# Patient Record
Sex: Male | Born: 1965 | Race: White | Hispanic: No | Marital: Married | Smoking: Former smoker
Health system: Southern US, Community
[De-identification: ages and names within clinical notes are randomized; demographics above are authoritative.]

## PROBLEM LIST (undated history)

## (undated) DIAGNOSIS — M1990 Unspecified osteoarthritis, unspecified site: Secondary | ICD-10-CM

## (undated) DIAGNOSIS — F419 Anxiety disorder, unspecified: Secondary | ICD-10-CM

## (undated) DIAGNOSIS — M25519 Pain in unspecified shoulder: Secondary | ICD-10-CM

## (undated) DIAGNOSIS — E785 Hyperlipidemia, unspecified: Secondary | ICD-10-CM

## (undated) DIAGNOSIS — R569 Unspecified convulsions: Secondary | ICD-10-CM

## (undated) DIAGNOSIS — G4730 Sleep apnea, unspecified: Secondary | ICD-10-CM

## (undated) HISTORY — PX: SURGERY SCROTAL / TESTICULAR: SUR1316

## (undated) HISTORY — DX: Pain in unspecified shoulder: M25.519

## (undated) HISTORY — DX: Unspecified osteoarthritis, unspecified site: M19.90

## (undated) HISTORY — PX: WISDOM TOOTH EXTRACTION: SHX21

## (undated) HISTORY — PX: TESTICLE SURGERY: SHX794

## (undated) HISTORY — DX: Sleep apnea, unspecified: G47.30

## (undated) HISTORY — DX: Unspecified convulsions: R56.9

## (undated) HISTORY — DX: Anxiety disorder, unspecified: F41.9

## (undated) HISTORY — DX: Hyperlipidemia, unspecified: E78.5

---

## 1992-10-30 HISTORY — PX: APPENDECTOMY: SHX54

## 2005-01-23 ENCOUNTER — Ambulatory Visit: Payer: Self-pay | Admitting: Internal Medicine

## 2006-05-07 ENCOUNTER — Ambulatory Visit: Payer: Self-pay | Admitting: Internal Medicine

## 2006-08-13 ENCOUNTER — Ambulatory Visit: Payer: Self-pay | Admitting: Internal Medicine

## 2007-06-18 ENCOUNTER — Telehealth: Payer: Self-pay | Admitting: Internal Medicine

## 2007-06-19 ENCOUNTER — Ambulatory Visit: Payer: Self-pay | Admitting: Internal Medicine

## 2007-06-19 LAB — CONVERTED CEMR LAB
ALT: 27 units/L (ref 0–53)
AST: 26 units/L (ref 0–37)
Albumin: 4 g/dL (ref 3.5–5.2)
Alkaline Phosphatase: 45 units/L (ref 39–117)
BUN: 14 mg/dL (ref 6–23)
Basophils Absolute: 0 10*3/uL (ref 0.0–0.1)
Basophils Relative: 0.3 % (ref 0.0–1.0)
Bilirubin Urine: NEGATIVE
Bilirubin, Direct: 0.1 mg/dL (ref 0.0–0.3)
Blood in Urine, dipstick: NEGATIVE
CO2: 26 meq/L (ref 19–32)
Calcium: 9.5 mg/dL (ref 8.4–10.5)
Chloride: 107 meq/L (ref 96–112)
Cholesterol: 151 mg/dL (ref 0–200)
Creatinine, Ser: 1 mg/dL (ref 0.4–1.5)
Eosinophils Absolute: 0.1 10*3/uL (ref 0.0–0.6)
Eosinophils Relative: 1.7 % (ref 0.0–5.0)
GFR calc Af Amer: 106 mL/min
GFR calc non Af Amer: 88 mL/min
Glucose, Bld: 82 mg/dL (ref 70–99)
Glucose, Urine, Semiquant: NEGATIVE
HCT: 39.6 % (ref 39.0–52.0)
HDL: 38.7 mg/dL — ABNORMAL LOW (ref >39.0)
Hemoglobin: 14.2 g/dL (ref 13.0–17.0)
LDL Cholesterol: 94 mg/dL (ref 0–99)
Lymphocytes Relative: 46.3 % — ABNORMAL HIGH (ref 12.0–46.0)
MCHC: 35.8 g/dL (ref 30.0–36.0)
MCV: 89.5 fL (ref 78.0–100.0)
Monocytes Absolute: 0.3 10*3/uL (ref 0.2–0.7)
Monocytes Relative: 8.5 % (ref 3.0–11.0)
Neutro Abs: 1.7 10*3/uL (ref 1.4–7.7)
Neutrophils Relative %: 43.2 % (ref 43.0–77.0)
Nitrite: NEGATIVE
Platelets: 168 10*3/uL (ref 150–400)
Potassium: 3.9 meq/L (ref 3.5–5.1)
Protein, U semiquant: NEGATIVE
RBC: 4.42 M/uL (ref 4.22–5.81)
RDW: 11.6 % (ref 11.5–14.6)
Sodium: 143 meq/L (ref 135–145)
Specific Gravity, Urine: 1.02
TSH: 2.68 microintl units/mL (ref 0.35–5.50)
Total Bilirubin: 1 mg/dL (ref 0.3–1.2)
Total CHOL/HDL Ratio: 3.9
Total Protein: 7.2 g/dL (ref 6.0–8.3)
Triglycerides: 93 mg/dL (ref 0–149)
Urobilinogen, UA: 0.2
VLDL: 19 mg/dL (ref 0–40)
Valproic Acid Lvl: 74.9 ug/mL (ref 50.0–100.0)
WBC Urine, dipstick: NEGATIVE
WBC: 3.9 10*3/uL — ABNORMAL LOW (ref 4.5–10.5)
pH: 6

## 2007-06-28 DIAGNOSIS — R569 Unspecified convulsions: Secondary | ICD-10-CM

## 2007-06-28 DIAGNOSIS — E785 Hyperlipidemia, unspecified: Secondary | ICD-10-CM

## 2007-06-28 HISTORY — DX: Unspecified convulsions: R56.9

## 2007-06-28 HISTORY — DX: Hyperlipidemia, unspecified: E78.5

## 2007-07-02 ENCOUNTER — Ambulatory Visit: Payer: Self-pay | Admitting: Internal Medicine

## 2007-11-08 ENCOUNTER — Ambulatory Visit: Payer: Self-pay | Admitting: Internal Medicine

## 2008-08-10 ENCOUNTER — Telehealth: Payer: Self-pay | Admitting: Internal Medicine

## 2009-03-08 ENCOUNTER — Telehealth: Payer: Self-pay | Admitting: Internal Medicine

## 2009-04-13 ENCOUNTER — Ambulatory Visit: Payer: Self-pay | Admitting: Internal Medicine

## 2009-04-13 LAB — CONVERTED CEMR LAB
Glucose, Urine, Semiquant: NEGATIVE
Nitrite: NEGATIVE
Specific Gravity, Urine: 1.02
WBC Urine, dipstick: NEGATIVE
pH: 5.5

## 2009-04-14 LAB — CONVERTED CEMR LAB
AST: 24 units/L (ref 0–37)
Albumin: 4.3 g/dL (ref 3.5–5.2)
Alkaline Phosphatase: 59 units/L (ref 39–117)
Basophils Absolute: 0 10*3/uL (ref 0.0–0.1)
Basophils Relative: 0 % (ref 0.0–3.0)
CO2: 27 meq/L (ref 19–32)
Calcium: 9.6 mg/dL (ref 8.4–10.5)
Eosinophils Absolute: 0.1 10*3/uL (ref 0.0–0.7)
Glucose, Bld: 90 mg/dL (ref 70–99)
HCT: 40.3 % (ref 39.0–52.0)
Hemoglobin: 14.3 g/dL (ref 13.0–17.0)
Lymphocytes Relative: 35.1 % (ref 12.0–46.0)
Lymphs Abs: 1.8 10*3/uL (ref 0.7–4.0)
MCHC: 35.5 g/dL (ref 30.0–36.0)
Monocytes Relative: 6 % (ref 3.0–12.0)
Neutro Abs: 2.8 10*3/uL (ref 1.4–7.7)
Potassium: 4.3 meq/L (ref 3.5–5.1)
RBC: 4.49 M/uL (ref 4.22–5.81)
RDW: 11.6 % (ref 11.5–14.6)
Sodium: 142 meq/L (ref 135–145)
TSH: 1.65 microintl units/mL (ref 0.35–5.50)
Total CHOL/HDL Ratio: 4
Total Protein: 7.5 g/dL (ref 6.0–8.3)

## 2010-05-06 ENCOUNTER — Telehealth: Payer: Self-pay | Admitting: Internal Medicine

## 2010-05-26 ENCOUNTER — Ambulatory Visit: Payer: Self-pay | Admitting: Internal Medicine

## 2010-05-27 LAB — CONVERTED CEMR LAB
Albumin: 4.3 g/dL (ref 3.5–5.2)
Alkaline Phosphatase: 54 units/L (ref 39–117)
Basophils Relative: 0.9 % (ref 0.0–3.0)
CO2: 24 meq/L (ref 19–32)
Chloride: 102 meq/L (ref 96–112)
Eosinophils Absolute: 0.1 10*3/uL (ref 0.0–0.7)
Glucose, Bld: 87 mg/dL (ref 70–99)
HCT: 40.4 % (ref 39.0–52.0)
Hemoglobin: 14 g/dL (ref 13.0–17.0)
Lymphocytes Relative: 39.7 % (ref 12.0–46.0)
Lymphs Abs: 1.9 10*3/uL (ref 0.7–4.0)
MCHC: 34.7 g/dL (ref 30.0–36.0)
MCV: 91.4 fL (ref 78.0–100.0)
Monocytes Absolute: 0.4 10*3/uL (ref 0.1–1.0)
Neutro Abs: 2.4 10*3/uL (ref 1.4–7.7)
Potassium: 3.8 meq/L (ref 3.5–5.1)
RBC: 4.42 M/uL (ref 4.22–5.81)
RDW: 12.4 % (ref 11.5–14.6)
Sodium: 140 meq/L (ref 135–145)
TSH: 2.61 microintl units/mL (ref 0.35–5.50)
Total CHOL/HDL Ratio: 4
Total Protein: 7.1 g/dL (ref 6.0–8.3)

## 2010-09-12 ENCOUNTER — Encounter: Payer: Self-pay | Admitting: Internal Medicine

## 2010-12-01 NOTE — Assessment & Plan Note (Signed)
Summary: CPX (PT WILL COME IN FASTING) // RS   Vital Signs:  Patient profile:   45 year old male Height:      71.75 inches Weight:      228 pounds BMI:     31.25 Temp:     98.1 degrees F oral Pulse rate:   58 / minute Pulse rhythm:   regular BP sitting:   110 / 80  (left arm) Cuff size:   large  Vitals Entered By: Kern Reap CMA Duncan Dull) (May 26, 2010 7:55 AM) CC: yearly physical Is Patient Diabetic? No Pain Assessment Patient in pain? no        CC:  yearly physical.  History of Present Illness: cpx  Preventive Screening-Counseling & Management  Alcohol-Tobacco     Smoking Status: never  Current Problems (verified): 1)  Well Adult Exam  (ICD-V70.0) 2)  Seizure Disorder  (ICD-780.39) 3)  Hyperlipidemia  (ICD-272.4)  Current Medications (verified): 1)  Lipitor 20 Mg  Tabs (Atorvastatin Calcium) .... Take 1 Tablet By Mouth At Bedtime 2)  Keppra Xr 750 Mg Xr24h-Tab (Levetiracetam) .... Take Two At Bedtime  Allergies (verified): 1)  ! 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   Eyes:  pupils equal and pupils round.   Ears:  R ear normal and L ear normal.   Nose:  no external deformity and no external  erythema.   Neck:  No deformities, masses, or tenderness noted. Chest Wall:  No deformities, masses, tenderness or gynecomastia noted. Lungs:  Normal respiratory effort, chest expands symmetrically. Lungs are clear to auscultation, no crackles or wheezes. Heart:  Normal rate and regular rhythm. S1 and S2 normal without gallop, murmur, click, rub or other extra sounds. Abdomen:  soft and non-tender.  overweight Rectal:  no external abnormalities and no hemorrhoids.   Prostate:  no gland enlargement, no nodules, and no asymmetry.   Msk:  No deformity or scoliosis noted of thoracic or lumbar spine.   Extremities:  No clubbing, cyanosis, edema, or deformity noted  Neurologic:  alert & oriented X3, cranial nerves II-XII intact, and gait normal.     Impression & Recommendations:  Problem # 1:  WELL ADULT EXAM (ICD-V70.0) health maint UTD Orders: Venipuncture (04540) TLB-Lipid Panel (80061-LIPID) TLB-BMP (Basic Metabolic Panel-BMET) (80048-METABOL) TLB-CBC Platelet - w/Differential (85025-CBCD) TLB-Hepatic/Liver Function Pnl (80076-HEPATIC) TLB-TSH (Thyroid Stimulating Hormone) (84443-TSH) UA Dipstick w/o Micro (automated)  (81003)  Problem # 2:  HYPERLIPIDEMIA (ICD-272.4)  His updated medication list for this problem includes:    Lipitor 20 Mg Tabs (Atorvastatin calcium) .Marland Kitchen... Take 1 tablet by mouth at bedtime at the end of the exam he wanted to discuss 3 medications he takes multiple "red eye flights" and would like ambien---discussed side effects : see Rx  he wonders about the use of xanax---he doesn't really have an anxiety disorder--i advised against use  Problem # 4:  R/O ADD (ICD-314.00)  pt says he used to take  adderall and wonders whether he should resume for ability to concentrate at work. he has never had testing-- schedule ADD testing  Orders: Psychology Referral (Psychology)  Complete Medication List: 1)  Lipitor 20 Mg Tabs (Atorvastatin calcium) .... Take 1 tablet  by mouth at bedtime 2)  Keppra Xr 750 Mg Xr24h-tab (Levetiracetam) .... Take two at bedtime 3)  Zolpidem Tartrate 5 Mg Tabs (Zolpidem tartrate) .Marland Kitchen.. 1 by mouth at night as needed Prescriptions: ZOLPIDEM TARTRATE 5 MG  TABS (ZOLPIDEM TARTRATE) 1 by mouth at night as needed  #10 x 1   Entered and Authorized by:   Birdie Sons MD   Signed by:   Birdie Sons MD on 05/26/2010   Method used:   Print then Give to Patient   RxID:   [id number] LIPITOR 20 MG  TABS (ATORVASTATIN CALCIUM) Take 1 tablet by mouth at bedtime  #90 x 3   Entered and Authorized by:   Birdie Sons MD   Signed by:   Birdie Sons MD on 05/26/2010   Method used:   Faxed to ...       [facility]mail[facility]order)       [address] [place], [place]  [address]       Ph: [phone]       Fax: [phone]   RxID:   [id number]     Appended Document: CPX (PT WILL COME IN FASTING) // RS  Laboratory Results   Urine Tests    Routine Urinalysis   Color: yellow Appearance: Clear Glucose: negative   (Normal Range: Negative) Bilirubin: negative   (Normal Range: Negative) Ketone: negative   (Normal Range: Negative) Spec. Gravity: 1.025   (Normal Range: 1.003-1.035) Blood: negative   (Normal Range: Negative) pH: 5.5   (Normal Range: 5.0-8.0) Protein: negative   (Normal Range: Negative) Urobilinogen: 0.2   (Normal Range: 0-1) Nitrite: negative   (Normal Range: Negative) Leukocyte Esterace: negative   (Normal Range: Negative)    Comments: Rita Ohara  May 26, 2010 10:46 AM

## 2010-12-01 NOTE — Progress Notes (Signed)
Summary: REFILL REQUEST  Phone Note Refill Request Message from:  Patient on May 06, 2010 3:51 PM  Refills Requested: Medication #1:  LIPITOR 20 MG  TABS Take 1 tablet by mouth at bedtime   Notes: Contractor.... Pt has made appt for CPX on 05/26/2010 with Dr Cato Mulligan, needs enough of med to last till his appt time.    Initial call taken by: Debbra Riding,  May 06, 2010 3:52 PM    Pt must have ov.Pharmacy and pt notified.  Appended Document: lipitor Prescriptions: LIPITOR 20 MG  TABS (ATORVASTATIN CALCIUM) Take 1 tablet by mouth at bedtime  #90 x 1   Entered by:   Gladis Riffle, RN   Authorized by:   Birdie Sons MD   Signed by:   Gladis Riffle, RN on 05/06/2010   Method used:   Printed then faxed to ...       [facility])       [address] [place], [place]  [address]       Ph: [phone]       Fax: [phone]   RxID:   [id number]  has appt cpx on 05/26/10 so done x1

## 2010-12-01 NOTE — Letter (Signed)
Summary: [facility]  [facility]   Imported By: Maryln Gottron 09/16/2010 12:10:55  _____________________________________________________________________  External Attachment:    Type:   Image     Comment:   External Document

## 2011-07-19 ENCOUNTER — Other Ambulatory Visit: Payer: Self-pay | Admitting: Internal Medicine

## 2011-09-18 ENCOUNTER — Encounter: Payer: Self-pay | Admitting: Internal Medicine

## 2011-09-18 ENCOUNTER — Encounter: Payer: Self-pay | Admitting: *Deleted

## 2011-09-18 ENCOUNTER — Ambulatory Visit: Payer: Self-pay | Admitting: Internal Medicine

## 2011-09-19 ENCOUNTER — Ambulatory Visit (INDEPENDENT_AMBULATORY_CARE_PROVIDER_SITE_OTHER): Payer: BC Managed Care – PPO | Admitting: Internal Medicine

## 2011-09-19 ENCOUNTER — Encounter: Payer: Self-pay | Admitting: Internal Medicine

## 2011-09-19 VITALS — BP 126/80 | Temp 98.0°F | Wt 239.0 lb

## 2011-09-19 DIAGNOSIS — Z23 Encounter for immunization: Secondary | ICD-10-CM

## 2011-09-19 DIAGNOSIS — Z0000 Encounter for general adult medical examination without abnormal findings: Secondary | ICD-10-CM

## 2011-09-19 MED ORDER — PROPRANOLOL HCL 20 MG PO TABS: 20.0000 mg | ORAL_TABLET | Freq: Three times a day (TID) | ORAL | Status: DC

## 2011-09-19 MED ORDER — ATORVASTATIN CALCIUM 20 MG PO TABS: 20.0000 mg | ORAL_TABLET | Freq: Every day | ORAL | Status: DC

## 2011-09-19 MED ORDER — ZOLPIDEM TARTRATE 5 MG PO TABS: 5.0000 mg | ORAL_TABLET | Freq: Every evening | ORAL | Status: DC | PRN

## 2011-09-19 MED ORDER — TOBRAMYCIN-DEXAMETHASONE 0.3-0.1 % OP SUSP: 1.0000 [drp] | OPHTHALMIC | Status: AC

## 2011-09-19 MED ORDER — TOBRAMYCIN-DEXAMETHASONE 0.3-0.1 % OP SUSP: 1.0000 [drp] | OPHTHALMIC | Status: DC

## 2011-09-19 MED ORDER — LEVETIRACETAM 750 MG PO TABS: 1500.0000 mg | ORAL_TABLET | Freq: Every day | ORAL | Status: DC

## 2011-09-19 NOTE — Progress Notes (Signed)
  Subjective:    Patient ID: Manuel Hartman, male    DOB: 21-Jul-1966, 45 y.o.   MRN: [id number]  HPI A 45 year old patient who is seen today with concerns involving his right eye. Over the past 2 months he has had intermittent redness with foreign body sensation and occasional exudate. At one point he used the his daughter's eye drops for several days with improvement. No visual disturbance or eye pain  He is also requesting a refill on beta blocker therapy for performance anxiety  Review of Systems  Eyes: Positive for discharge, redness and itching. Negative for photophobia, pain and visual disturbance.       Objective:   Physical Exam  Constitutional: He appears well-developed and well-nourished. No distress.  HENT:  Head: Normocephalic.  Right Ear: External ear normal.  Left Ear: External ear normal.  Mouth/Throat: Oropharynx is clear and moist.  Eyes: Pupils are equal, round, and reactive to light. Right eye exhibits no discharge. Left eye exhibits no discharge.       Mild corneal injection bilaterally the right greater than the left No stye noted No exudate          Assessment & Plan:   Subacute conjunctivitis. This has been partially treated with antibiotic therapy. In view of the echogenicity of the symptoms we'll treat aggressively with TobraDex for 7 days

## 2011-10-09 ENCOUNTER — Telehealth: Payer: Self-pay | Admitting: Internal Medicine

## 2011-10-09 NOTE — Telephone Encounter (Signed)
Offered pt ov on 10/10/11 pt states he will be going out of town and he needs to work.  Pt states he will go to urgent care.

## 2011-10-09 NOTE — Telephone Encounter (Signed)
Pt experiencing Rash around eyes, sore throat, congestion,cough, headaches, sneezing runny and stuff nose. Pt would like to be seen today

## 2011-10-12 ENCOUNTER — Other Ambulatory Visit: Payer: Self-pay | Admitting: Internal Medicine

## 2011-10-12 NOTE — Telephone Encounter (Signed)
Pt called req 1 month supply for atorvastatin (LIPITOR) 20 MG tablet [facility] or call in a year supply to [facility] mail order fax# [phone]. Pts [facility] id # [id number]

## 2011-10-13 MED ORDER — ATORVASTATIN CALCIUM 20 MG PO TABS: 20.0000 mg | ORAL_TABLET | Freq: Every day | ORAL | Status: DC

## 2011-10-13 NOTE — Telephone Encounter (Signed)
rx sent in electronically 

## 2011-10-18 ENCOUNTER — Ambulatory Visit (INDEPENDENT_AMBULATORY_CARE_PROVIDER_SITE_OTHER): Payer: BC Managed Care – PPO | Admitting: Family Medicine

## 2011-10-18 ENCOUNTER — Encounter: Payer: Self-pay | Admitting: Family Medicine

## 2011-10-18 VITALS — BP 120/90 | HR 82 | Temp 98.4°F | Wt 234.0 lb

## 2011-10-18 DIAGNOSIS — J40 Bronchitis, not specified as acute or chronic: Secondary | ICD-10-CM

## 2011-10-18 DIAGNOSIS — R05 Cough: Secondary | ICD-10-CM

## 2011-10-18 MED ORDER — PREDNISONE 20 MG PO TABS: 20.0000 mg | ORAL_TABLET | Freq: Every day | ORAL | Status: DC

## 2011-10-18 MED ORDER — PREDNISONE 20 MG PO TABS: 20.0000 mg | ORAL_TABLET | Freq: Every day | ORAL | Status: AC

## 2011-10-18 MED ORDER — ATORVASTATIN CALCIUM 20 MG PO TABS: 20.0000 mg | ORAL_TABLET | Freq: Every day | ORAL | Status: DC

## 2011-10-18 NOTE — Progress Notes (Signed)
Addended by: Beverely Low on: 10/18/2011 02:23 PM   Modules accepted: Orders

## 2011-10-18 NOTE — Progress Notes (Signed)
  Subjective:    Patient ID: Manuel Hartman, male    DOB: 01/14/1966, 45 y.o.   MRN: [id number]  HPI Comments: A 45 year old white male, here with complaints of cough, congestion has been going on since December 7. He was seen at urgent care, who tested him for flu and strep that both were negative. He was started on clindamycin and continues to take it today. Has concerns that he has difficulty breathing at times and wheezing. Denies any way headedness, no chest pain, fever, muscle aches, nausea, vomiting, or diaphoresis.  Cough Associated symptoms include postnasal drip and wheezing.      Review of Systems  Constitutional: Positive for fatigue.  HENT: Positive for congestion and postnasal drip.   Eyes: Negative.   Respiratory: Positive for cough, chest tightness and wheezing.   Cardiovascular: Negative.   Musculoskeletal: Negative.   Skin: Negative.    Past Medical History  Diagnosis Date  . HYPERLIPIDEMIA 06/28/2007  . SEIZURE DISORDER 06/28/2007    History   Social History  . Marital Status: Married    Spouse Name: N/A    Number of Children: N/A  . Years of Education: N/A   Occupational History  . Not on file.   Social History Main Topics  . Smoking status: Never Smoker   . Smokeless tobacco: Never Used  . Alcohol Use: Yes  . Drug Use: No  . Sexually Active: Not on file   Other Topics Concern  . Not on file   Social History Narrative  . No narrative on file    Past Surgical History  Procedure Date  . Appendectomy 1994  . 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 Cancer Mother     gastric  . Heart disease Father   . Cancer Sister     breast    Allergies  Allergen Reactions  . Penicillins     Current Outpatient Prescriptions on File Prior to Visit  Medication Sig Dispense Refill  . levETIRAcetam (KEPPRA) 750 MG tablet Take 2 tablets (1,500 mg total) by mouth at bedtime. 2 tabs at bedtime   180 tablet  3  . propranolol (INDERAL) 20 MG tablet Take 1 tablet (20 mg total) by mouth 3 (three) times daily.  60 tablet  4  . zolpidem (AMBIEN) 5 MG tablet Take 1 tablet (5 mg total) by mouth at bedtime as needed.  30 tablet  1    BP 120/90  Pulse 82  Temp(Src) 98.4 F (36.9 C) (Oral)  Wt 234 lb (106.142 kg)  SpO2 98%chart    Objective:   Physical Exam  Constitutional: He is oriented to person, place, and time. He appears well-developed and well-nourished.  Neck: Normal range of motion. Neck supple.  Cardiovascular: Normal rate, regular rhythm and normal heart sounds.   Pulmonary/Chest: Effort normal.       Course breath sounds to auscultation. Good air movement,  Neurological: He is alert and oriented to person, place, and time.  Skin: Skin is warm and dry.  Psychiatric: He has a normal mood and affect.          Assessment & Plan:  Assessment: Acute bronchitis  The plan: Prednisone 60 mg by mouth every morning x5 days. Complete antibiotics. Over the counter symptomatic treatment for relief. Call if symptoms worsen or persist. Recheck as scheduled and sooner when necessary.

## 2011-10-18 NOTE — Patient Instructions (Signed)

## 2011-10-23 ENCOUNTER — Encounter: Payer: Self-pay | Admitting: Internal Medicine

## 2011-10-23 ENCOUNTER — Ambulatory Visit (INDEPENDENT_AMBULATORY_CARE_PROVIDER_SITE_OTHER): Payer: BC Managed Care – PPO | Admitting: Internal Medicine

## 2011-10-23 VITALS — BP 118/90 | Temp 98.0°F | Wt 237.0 lb

## 2011-10-23 DIAGNOSIS — J069 Acute upper respiratory infection, unspecified: Secondary | ICD-10-CM

## 2011-10-23 DIAGNOSIS — Z0000 Encounter for general adult medical examination without abnormal findings: Secondary | ICD-10-CM

## 2011-10-23 MED ORDER — METHYLPREDNISOLONE ACETATE PF 40 MG/ML IJ SUSP
40.0000 mg | Freq: Once | INTRAMUSCULAR | Status: AC
  Administered 2011-10-23: 40 mg via INTRAMUSCULAR

## 2011-10-23 NOTE — Progress Notes (Signed)
  Subjective:    Patient ID: Manuel Hartman, male    DOB: April 12, 1966, 45 y.o.   MRN: [id number]  HPI  45 year old patient who is seen today for followup of a bronchitis. He was seen at an urgent care earlier and was treated with clindamycin for strep pharyngitis. He has a penicillin allergy. He was seen here approximately one week ago and has just completed a brief treatment with oral prednisone. He seems to be improving daily but still has cough and exercise intolerance. He will be leaving soon for a trip to [place] for a family ski vacation.    Review of Systems  Constitutional: Positive for fatigue. Negative for fever, chills and appetite change.  HENT: Negative for hearing loss, ear pain, congestion, sore throat, trouble swallowing, neck stiffness, dental problem, voice change and tinnitus.   Eyes: Negative for pain, discharge and visual disturbance.  Respiratory: Positive for cough. Negative for chest tightness, wheezing and stridor.   Cardiovascular: Negative for chest pain, palpitations and leg swelling.  Gastrointestinal: Negative for nausea, vomiting, abdominal pain, diarrhea, constipation, blood in stool and abdominal distention.  Genitourinary: Negative for urgency, hematuria, flank pain, discharge, difficulty urinating and genital sores.  Musculoskeletal: Negative for myalgias, back pain, joint swelling, arthralgias and gait problem.  Skin: Negative for rash.  Neurological: Negative for dizziness, syncope, speech difficulty, weakness, numbness and headaches.  Hematological: Negative for adenopathy. Does not bruise/bleed easily.  Psychiatric/Behavioral: Negative for behavioral problems and dysphoric mood. The patient is not nervous/anxious.        Objective:   Physical Exam  Constitutional: He is oriented to person, place, and time. He appears well-developed.  HENT:  Head: Normocephalic.  Right Ear: External ear normal.  Left Ear: External ear normal.  Eyes: Conjunctivae and EOM  are normal.  Neck: Normal range of motion.  Cardiovascular: Normal rate and normal heart sounds.   Pulmonary/Chest: Effort normal and breath sounds normal.  Abdominal: Bowel sounds are normal.  Musculoskeletal: Normal range of motion. He exhibits no edema and no tenderness.  Neurological: He is alert and oriented to person, place, and time.  Psychiatric: He has a normal mood and affect. His behavior is normal.          Assessment & Plan:   Viral URI with cough and occasional wheezing. Was given samples of Symbicort  to use twice daily if he develops wheezing He will attempt to get  plenty of rest, Drink lots of  clear liquids, and use Tylenol or ibuprofen for fever and discomfort.

## 2011-10-23 NOTE — Patient Instructions (Signed)
Get plenty of rest, Drink lots of  clear liquids, and use Tylenol or ibuprofen for fever and discomfort.    

## 2011-11-01 ENCOUNTER — Other Ambulatory Visit (INDEPENDENT_AMBULATORY_CARE_PROVIDER_SITE_OTHER): Payer: BC Managed Care – PPO

## 2011-11-01 DIAGNOSIS — Z0000 Encounter for general adult medical examination without abnormal findings: Secondary | ICD-10-CM

## 2011-11-01 LAB — CBC WITH DIFFERENTIAL/PLATELET
Basophils Relative: 0.4 % (ref 0.0–3.0)
Eosinophils Relative: 0.9 % (ref 0.0–5.0)
HCT: 43 % (ref 39.0–52.0)
Lymphs Abs: 2 10*3/uL (ref 0.7–4.0)
MCV: 92.4 fl (ref 78.0–100.0)
Monocytes Absolute: 0.7 10*3/uL (ref 0.1–1.0)
Platelets: 175 10*3/uL (ref 150.0–400.0)
RBC: 4.65 Mil/uL (ref 4.22–5.81)
WBC: 9.3 10*3/uL (ref 4.5–10.5)

## 2011-11-01 LAB — POCT URINALYSIS DIPSTICK
Bilirubin, UA: NEGATIVE
Blood, UA: NEGATIVE
Glucose, UA: NEGATIVE
Ketones, UA: NEGATIVE
Nitrite, UA: NEGATIVE
Spec Grav, UA: 1.025

## 2011-11-01 LAB — HEPATIC FUNCTION PANEL
ALT: 46 U/L (ref 0–53)
Albumin: 4.2 g/dL (ref 3.5–5.2)
Total Protein: 7.3 g/dL (ref 6.0–8.3)

## 2011-11-01 LAB — BASIC METABOLIC PANEL
BUN: 15 mg/dL (ref 6–23)
CO2: 24 mEq/L (ref 19–32)
Sodium: 141 mEq/L (ref 135–145)

## 2011-11-01 LAB — LIPID PANEL
Cholesterol: 179 mg/dL (ref 0–200)
Triglycerides: 101 mg/dL (ref 0.0–149.0)

## 2011-11-07 ENCOUNTER — Encounter: Payer: Self-pay | Admitting: Internal Medicine

## 2011-12-01 ENCOUNTER — Ambulatory Visit (INDEPENDENT_AMBULATORY_CARE_PROVIDER_SITE_OTHER): Payer: BC Managed Care – PPO | Admitting: Internal Medicine

## 2011-12-01 ENCOUNTER — Encounter: Payer: Self-pay | Admitting: Internal Medicine

## 2011-12-01 VITALS — BP 126/84 | HR 88 | Temp 98.1°F | Ht 71.5 in | Wt 233.0 lb

## 2011-12-01 DIAGNOSIS — F909 Attention-deficit hyperactivity disorder, unspecified type: Secondary | ICD-10-CM

## 2011-12-01 DIAGNOSIS — Z0000 Encounter for general adult medical examination without abnormal findings: Secondary | ICD-10-CM

## 2011-12-01 NOTE — Progress Notes (Signed)
Patient ID: Manuel Hartman, male   DOB: 06-Oct-1966, 46 y.o.   MRN: [id number] cpx  Past Medical History  Diagnosis Date  . HYPERLIPIDEMIA 06/28/2007  . SEIZURE DISORDER 06/28/2007    History   Social History  . Marital Status: Married    Spouse Name: N/A    Number of Children: N/A  . Years of Education: N/A   Occupational History  . Not on file.   Social History Main Topics  . Smoking status: Never Smoker   . Smokeless tobacco: Never Used  . Alcohol Use: Yes  . Drug Use: No  . Sexually Active: Not on file   Other Topics Concern  . Not on file   Social History Narrative  . No narrative on file    Past Surgical History  Procedure Date  . Appendectomy 1994  . 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 Cancer Mother     gastric  . Heart disease Father   . Cancer Sister     breast    Allergies  Allergen Reactions  . Penicillins     Current Outpatient Prescriptions on File Prior to Visit  Medication Sig Dispense Refill  . atorvastatin (LIPITOR) 20 MG tablet Take 1 tablet (20 mg total) by mouth daily.  90 tablet  3  . levETIRAcetam (KEPPRA) 750 MG tablet Take 2 tablets (1,500 mg total) by mouth at bedtime. 2 tabs at bedtime  180 tablet  3     patient denies chest pain, shortness of breath, orthopnea. Denies lower extremity edema, abdominal pain, change in appetite, change in bowel movements. Patient denies rashes, musculoskeletal complaints. No other specific complaints in a complete review of systems.   BP 126/84  Pulse 88  Temp(Src) 98.1 F (36.7 C) (Oral)  Ht 5' 11.5" (1.816 m)  Wt 233 lb (105.688 kg)  BMI 32.04 kg/m2  well-developed well-nourished male in no acute distress. HEENT exam atraumatic, normocephalic, neck supple without jugular venous distention. Chest clear to auscultation cardiac exam S1-S2 are regular. Abdominal exam overweight with bowel sounds, soft and nontender. Extremities no edema.  Neurologic exam is alert with a normal gait.  Well visit: health maint UTD Discussed need for weight loss Goal BMI<25

## 2011-12-27 ENCOUNTER — Other Ambulatory Visit: Payer: Self-pay | Admitting: Internal Medicine

## 2012-02-29 ENCOUNTER — Telehealth: Payer: Self-pay | Admitting: Internal Medicine

## 2012-02-29 MED ORDER — PROPRANOLOL HCL 20 MG PO TABS: 20.0000 mg | ORAL_TABLET | ORAL | Status: DC | PRN

## 2012-02-29 NOTE — Telephone Encounter (Signed)
rx sent in electronically 

## 2012-02-29 NOTE — Telephone Encounter (Signed)
Pt requesting refill on propranolol (INDERAL) 20 MG tablet   [facility]

## 2012-03-26 ENCOUNTER — Other Ambulatory Visit: Payer: Self-pay | Admitting: Internal Medicine

## 2012-08-30 ENCOUNTER — Other Ambulatory Visit: Payer: Self-pay | Admitting: Internal Medicine

## 2013-01-27 ENCOUNTER — Other Ambulatory Visit: Payer: Self-pay

## 2013-01-27 MED ORDER — AMPHETAMINE-DEXTROAMPHETAMINE 20 MG PO TABS: 20.0000 mg | ORAL_TABLET | Freq: Every day | ORAL | Status: DC

## 2013-01-27 NOTE — Telephone Encounter (Signed)
Requesting refill on Methylphenidate

## 2013-03-05 ENCOUNTER — Other Ambulatory Visit: Payer: Self-pay | Admitting: Internal Medicine

## 2013-05-07 ENCOUNTER — Telehealth: Payer: Self-pay | Admitting: Neurology

## 2013-05-08 ENCOUNTER — Other Ambulatory Visit: Payer: Self-pay

## 2013-05-08 MED ORDER — AMPHETAMINE-DEXTROAMPHETAMINE 20 MG PO TABS: 20.0000 mg | ORAL_TABLET | Freq: Every day | ORAL | Status: DC

## 2013-05-08 NOTE — Telephone Encounter (Signed)
Forwarded to provider for approval  

## 2013-06-18 ENCOUNTER — Other Ambulatory Visit: Payer: Self-pay | Admitting: Internal Medicine

## 2013-07-14 ENCOUNTER — Telehealth: Payer: Self-pay | Admitting: Internal Medicine

## 2013-07-14 MED ORDER — ATORVASTATIN CALCIUM 20 MG PO TABS: ORAL_TABLET | ORAL | Status: DC

## 2013-07-14 NOTE — Telephone Encounter (Signed)
done

## 2013-07-14 NOTE — Telephone Encounter (Signed)
Patient's refill for atorvastating was denied d/t OV needed. Patient has appt for cpx on 11/7. He is requesting enough refills to get him to this 11/7 appointment. [facility].

## 2013-08-11 ENCOUNTER — Ambulatory Visit (INDEPENDENT_AMBULATORY_CARE_PROVIDER_SITE_OTHER): Payer: Managed Care, Other (non HMO) | Admitting: Family

## 2013-08-11 ENCOUNTER — Encounter: Payer: Self-pay | Admitting: Family

## 2013-08-11 VITALS — BP 120/80 | HR 72 | Temp 98.1°F | Wt 236.0 lb

## 2013-08-11 DIAGNOSIS — M109 Gout, unspecified: Secondary | ICD-10-CM

## 2013-08-11 DIAGNOSIS — M25571 Pain in right ankle and joints of right foot: Secondary | ICD-10-CM

## 2013-08-11 DIAGNOSIS — M25579 Pain in unspecified ankle and joints of unspecified foot: Secondary | ICD-10-CM

## 2013-08-11 DIAGNOSIS — Z23 Encounter for immunization: Secondary | ICD-10-CM

## 2013-08-11 LAB — URIC ACID: Uric Acid, Serum: 7.7 mg/dL (ref 4.0–7.8)

## 2013-08-11 MED ORDER — KETOROLAC TROMETHAMINE 60 MG/2ML IM SOLN
60.0000 mg | Freq: Once | INTRAMUSCULAR | Status: AC
  Administered 2013-08-11: 60 mg via INTRAMUSCULAR

## 2013-08-11 MED ORDER — INDOMETHACIN 50 MG PO CAPS: 50.0000 mg | ORAL_CAPSULE | Freq: Two times a day (BID) | ORAL | Status: DC

## 2013-08-11 MED ORDER — HYDROCODONE-ACETAMINOPHEN 10-325 MG PO TABS: 1.0000 | ORAL_TABLET | Freq: Three times a day (TID) | ORAL | Status: DC | PRN

## 2013-08-11 NOTE — Patient Instructions (Signed)
Gout  Gout is an inflammatory condition (arthritis) caused by a buildup of uric acid crystals in the joints. Uric acid is a chemical that is normally present in the blood. Under some circumstances, uric acid can form into crystals in your joints. This causes joint redness, soreness, and swelling (inflammation). Repeat attacks are common. Over time, uric acid crystals can form into masses (tophi) near a joint, causing disfigurement. Gout is treatable and often preventable.  CAUSES   The disease begins with elevated levels of uric acid in the blood. Uric acid is produced by your body when it breaks down a naturally found substance called purines. This also happens when you eat certain foods such as meats and fish. Causes of an elevated uric acid level include:   Being passed down from parent to child (heredity).   Diseases that cause increased uric acid production (obesity, psoriasis, some cancers).   Excessive alcohol use.   Diet, especially diets rich in meat and seafood.   Medicines, including certain cancer-fighting drugs (chemotherapy), diuretics, and aspirin.   Chronic kidney disease. The kidneys are no longer able to remove uric acid well.   Problems with metabolism.  Conditions strongly associated with gout include:   Obesity.   High blood pressure.   High cholesterol.   Diabetes.  Not everyone with elevated uric acid levels gets gout. It is not understood why some people get gout and others do not. Surgery, joint injury, and eating too much of certain foods are some of the factors that can lead to gout.  SYMPTOMS    An attack of gout comes on quickly. It causes intense pain with redness, swelling, and warmth in a joint.   Fever can occur.   Often, only one joint is involved. Certain joints are more commonly involved:   Base of the big toe.   Knee.   Ankle.   Wrist.   Finger.  Without treatment, an attack usually goes away in a few days to weeks. Between attacks, you usually will not have  symptoms, which is different from many other forms of arthritis.  DIAGNOSIS   Your caregiver will suspect gout based on your symptoms and exam. Removal of fluid from the joint (arthrocentesis) is done to check for uric acid crystals. Your caregiver will give you a medicine that numbs the area (local anesthetic) and use a needle to remove joint fluid for exam. Gout is confirmed when uric acid crystals are seen in joint fluid, using a special microscope. Sometimes, blood, urine, and X-ray tests are also used.  TREATMENT   There are 2 phases to gout treatment: treating the sudden onset (acute) attack and preventing attacks (prophylaxis).  Treatment of an Acute Attack   Medicines are used. These include anti-inflammatory medicines or steroid medicines.   An injection of steroid medicine into the affected joint is sometimes necessary.   The painful joint is rested. Movement can worsen the arthritis.   You may use warm or cold treatments on painful joints, depending which works best for you.   Discuss the use of coffee, vitamin C, or cherries with your caregiver. These may be helpful treatment options.  Treatment to Prevent Attacks  After the acute attack subsides, your caregiver may advise prophylactic medicine. These medicines either help your kidneys eliminate uric acid from your body or decrease your uric acid production. You may need to stay on these medicines for a very long time.  The early phase of treatment with prophylactic medicine can be associated   with an increase in acute gout attacks. For this reason, during the first few months of treatment, your caregiver may also advise you to take medicines usually used for acute gout treatment. Be sure you understand your caregiver's directions.  You should also discuss dietary treatment with your caregiver. Certain foods such as meats and fish can increase uric acid levels. Other foods such as dairy can decrease levels. Your caregiver can give you a list of foods  to avoid.  HOME CARE INSTRUCTIONS    Do not take aspirin to relieve pain. This raises uric acid levels.   Only take over-the-counter or prescription medicines for pain, discomfort, or fever as directed by your caregiver.   Rest the joint as much as possible. When in bed, keep sheets and blankets off painful areas.   Keep the affected joint raised (elevated).   Use crutches if the painful joint is in your leg.   Drink enough water and fluids to keep your urine clear or pale yellow. This helps your body get rid of uric acid. Do not drink alcoholic beverages. They slow the passage of uric acid.   Follow your caregiver's dietary instructions. Pay careful attention to the amount of protein you eat. Your daily diet should emphasize fruits, vegetables, whole grains, and fat-free or low-fat milk products.   Maintain a healthy body weight.  SEEK MEDICAL CARE IF:    You have an oral temperature above 102 F (38.9 C).   You develop diarrhea, vomiting, or any side effects from medicines.   You do not feel better in 24 hours, or you are getting worse.  SEEK IMMEDIATE MEDICAL CARE IF:    Your joint becomes suddenly more tender and you have:   Chills.   An oral temperature above 102 F (38.9 C), not controlled by medicine.  MAKE SURE YOU:    Understand these instructions.   Will watch your condition.   Will get help right away if you are not doing well or get worse.  Document Released: 10/13/2000 Document Revised: 01/08/2012 Document Reviewed: 01/24/2010  [employer] Patient Information 2014 [employer]

## 2013-08-11 NOTE — Progress Notes (Signed)
Subjective:    Patient ID: Manuel Hartman, male    DOB: May 15, 1966, 47 y.o.   MRN: [id number]  HPI  47 year old white male, nonsmoker exam today with concerns of an acute gout flare. He has a history of gout in the past. He reports that he drank heavier beer a day he typically drinks over the weekend. Has had an increase in beef intake.  Denies any shellfish. Recent pain in his right ankle a 9/10, worse with walking and movement. Describes it as red, swollen and tender to touch.  Review of Systems  Constitutional: Negative.   Respiratory: Negative.   Cardiovascular: Negative.   Endocrine: Negative.   Musculoskeletal: Positive for arthralgias.       Right ankle pain and swelling.   Skin: Negative.   Psychiatric/Behavioral: Negative.   All other systems reviewed and are negative.   Past Medical History  Diagnosis Date  . HYPERLIPIDEMIA 06/28/2007  . SEIZURE DISORDER 06/28/2007    History   Social History  . Marital Status: Married    Spouse Name: N/A    Number of Children: N/A  . Years of Education: N/A   Occupational History  . Not on file.   Social History Main Topics  . Smoking status: Never Smoker   . Smokeless tobacco: Never Used  . Alcohol Use: Yes  . Drug Use: No  . Sexual Activity: Not on file   Other Topics Concern  . Not on file   Social History Narrative  . No narrative on file    Past Surgical History  Procedure Laterality Date  . Appendectomy  1994  . 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 Cancer Mother     gastric  . Heart disease Father   . Cancer Sister     breast    Allergies  Allergen Reactions  . Penicillins     Current Outpatient Prescriptions on File Prior to Visit  Medication Sig Dispense Refill  . amphetamine-dextroamphetamine (ADDERALL) 20 MG tablet Take 1 tablet (20 mg total) by mouth daily. States he takes irregularly  30 tablet  0  . atorvastatin (LIPITOR) 20 MG  tablet TAKE 1 TABLET DAILY  90 tablet  0  . levETIRAcetam (KEPPRA) 750 MG tablet Take 2 tablets (1,500 mg total) by mouth at bedtime. 2 tabs at bedtime  180 tablet  3  . propranolol (INDERAL) 20 MG tablet Take 1 tablet (20 mg total) by mouth as needed.  30 tablet  5   No current facility-administered medications on file prior to visit.    BP 120/80  Pulse 72  Temp(Src) 98.1 F (36.7 C) (Oral)  Wt 236 lb (107.049 kg)  BMI 32.46 kg/m2  SpO2 97%chart    Objective:   Physical Exam  Constitutional: He is oriented to person, place, and time. He appears well-developed and well-nourished.  Neck: Normal range of motion. Neck supple.  Cardiovascular: Normal rate, regular rhythm and normal heart sounds.   Pulmonary/Chest: Effort normal and breath sounds normal.  Musculoskeletal: He exhibits edema and tenderness.  Right ankle pain and redness.  Pain with ROM.   Neurological: He is alert and oriented to person, place, and time.  Skin: Skin is warm and dry.  Psychiatric: He has a normal mood and affect.          Assessment & Plan:  Assessment:  1. Gout 2. Right ankle pain  Plan:  Indomethacin 50 mg one tablet 3 times a day with food. Hydrocodone as needed for pain. Patient the office within a questions or concerns. Recheck as scheduled and as needed.

## 2013-08-28 ENCOUNTER — Other Ambulatory Visit (INDEPENDENT_AMBULATORY_CARE_PROVIDER_SITE_OTHER): Payer: Managed Care, Other (non HMO)

## 2013-08-28 DIAGNOSIS — Z0000 Encounter for general adult medical examination without abnormal findings: Secondary | ICD-10-CM

## 2013-08-28 LAB — POCT URINALYSIS DIPSTICK
Glucose, UA: NEGATIVE
Ketones, UA: NEGATIVE
Nitrite, UA: NEGATIVE
Protein, UA: NEGATIVE
Spec Grav, UA: 1.02
Urobilinogen, UA: 0.2
pH, UA: 6.5

## 2013-08-28 LAB — TSH: TSH: 2.3 u[IU]/mL (ref 0.35–5.50)

## 2013-08-28 LAB — BASIC METABOLIC PANEL
CO2: 25 mEq/L (ref 19–32)
Chloride: 105 mEq/L (ref 96–112)
Creatinine, Ser: 1 mg/dL (ref 0.4–1.5)
GFR: 85.69 mL/min (ref >60.00)
Potassium: 4.6 mEq/L (ref 3.5–5.1)
Sodium: 139 mEq/L (ref 135–145)

## 2013-08-28 LAB — LIPID PANEL
LDL Cholesterol: 86 mg/dL (ref 0–99)
Total CHOL/HDL Ratio: 3
Triglycerides: 123 mg/dL (ref 0.0–149.0)
VLDL: 24.6 mg/dL (ref 0.0–40.0)

## 2013-08-28 LAB — CBC WITH DIFFERENTIAL/PLATELET
Basophils Absolute: 0 10*3/uL (ref 0.0–0.1)
Basophils Relative: 0.6 % (ref 0.0–3.0)
Eosinophils Absolute: 0.1 10*3/uL (ref 0.0–0.7)
Eosinophils Relative: 2.4 % (ref 0.0–5.0)
Hemoglobin: 13.8 g/dL (ref 13.0–17.0)
MCHC: 34.4 g/dL (ref 30.0–36.0)
MCV: 89.6 fl (ref 78.0–100.0)
Monocytes Absolute: 0.3 10*3/uL (ref 0.1–1.0)
Neutro Abs: 2.2 10*3/uL (ref 1.4–7.7)
Neutrophils Relative %: 48 % (ref 43.0–77.0)
RBC: 4.49 Mil/uL (ref 4.22–5.81)
RDW: 12 % (ref 11.5–14.6)
WBC: 4.6 10*3/uL (ref 4.5–10.5)

## 2013-08-28 LAB — HEPATIC FUNCTION PANEL
ALT: 30 U/L (ref 0–53)
Albumin: 4.1 g/dL (ref 3.5–5.2)
Alkaline Phosphatase: 53 U/L (ref 39–117)
Bilirubin, Direct: 0.1 mg/dL (ref 0.0–0.3)
Total Bilirubin: 0.6 mg/dL (ref 0.3–1.2)
Total Protein: 7.4 g/dL (ref 6.0–8.3)

## 2013-08-29 ENCOUNTER — Other Ambulatory Visit: Payer: BC Managed Care – PPO

## 2013-09-05 ENCOUNTER — Encounter: Payer: Self-pay | Admitting: Internal Medicine

## 2013-09-05 ENCOUNTER — Ambulatory Visit (INDEPENDENT_AMBULATORY_CARE_PROVIDER_SITE_OTHER): Payer: Managed Care, Other (non HMO) | Admitting: Internal Medicine

## 2013-09-05 VITALS — BP 122/90 | HR 72 | Temp 97.9°F | Ht 73.0 in | Wt 235.0 lb

## 2013-09-05 DIAGNOSIS — M25551 Pain in right hip: Secondary | ICD-10-CM

## 2013-09-05 DIAGNOSIS — M25559 Pain in unspecified hip: Secondary | ICD-10-CM

## 2013-09-05 DIAGNOSIS — Z0000 Encounter for general adult medical examination without abnormal findings: Secondary | ICD-10-CM

## 2013-09-05 NOTE — Progress Notes (Signed)
Pre-visit discussion using our clinic review tool. No additional management support is needed unless otherwise documented below in the visit note.  

## 2013-09-05 NOTE — Progress Notes (Signed)
cpx  Past Medical History  Diagnosis Date  . HYPERLIPIDEMIA 06/28/2007  . SEIZURE DISORDER 06/28/2007    History   Social History  . Marital Status: Married    Spouse Name: N/A    Number of Children: N/A  . Years of Education: N/A   Occupational History  . Not on file.   Social History Main Topics  . Smoking status: Never Smoker   . Smokeless tobacco: Never Used  . Alcohol Use: Yes  . Drug Use: No  . Sexual Activity: Not on file   Other Topics Concern  . Not on file   Social History Narrative  . No narrative on file    Past Surgical History  Procedure Laterality Date  . Appendectomy  1994  . 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 Cancer Mother     gastric  . Heart disease Father   . Cancer Sister     breast    Allergies  Allergen Reactions  . Penicillins     Current Outpatient Prescriptions on File Prior to Visit  Medication Sig Dispense Refill  . amphetamine-dextroamphetamine (ADDERALL) 20 MG tablet Take 1 tablet (20 mg total) by mouth daily. States he takes irregularly  30 tablet  0  . atorvastatin (LIPITOR) 20 MG tablet TAKE 1 TABLET DAILY  90 tablet  0  . levETIRAcetam (KEPPRA) 750 MG tablet Take 2 tablets (1,500 mg total) by mouth at bedtime. 2 tabs at bedtime  180 tablet  3  . propranolol (INDERAL) 20 MG tablet Take 1 tablet (20 mg total) by mouth as needed.  30 tablet  5   No current facility-administered medications on file prior to visit.     patient denies chest pain, shortness of breath, orthopnea. Denies lower extremity edema, abdominal pain, change in appetite, change in bowel movements. Patient denies rashes, musculoskeletal complaints (except right hip pain when first stands). No other specific complaints in a complete review of systems. occa  BP 122/90  Pulse 72  Temp(Src) 97.9 F (36.6 C) (Oral)  Ht 6\' 1"  (1.854 m)  Wt 235 lb (106.595 kg)  BMI 31.01 kg/m2   well-developed  well-nourished male in no acute distress. HEENT exam atraumatic, normocephalic, neck supple without jugular venous distention. Chest clear to auscultation cardiac exam S1-S2 are regular. Abdominal exam overweight with bowel sounds, soft and nontender. Extremities no edema. Neurologic exam is alert with a normal gait.   Well visit- needs to lose at least 40 pounds  Right hip pain- check xray (probably a soft tissue inflammation)  GOUT- no recurrence

## 2013-09-08 ENCOUNTER — Ambulatory Visit (INDEPENDENT_AMBULATORY_CARE_PROVIDER_SITE_OTHER)
Admission: RE | Admit: 2013-09-08 | Discharge: 2013-09-08 | Disposition: A | Discharge status: Home / Self Care | Payer: Managed Care, Other (non HMO) | Source: Ambulatory Visit | Attending: Internal Medicine | Admitting: Internal Medicine

## 2013-09-08 DIAGNOSIS — M25559 Pain in unspecified hip: Secondary | ICD-10-CM

## 2013-09-08 DIAGNOSIS — M25551 Pain in right hip: Secondary | ICD-10-CM

## 2013-09-16 ENCOUNTER — Telehealth: Payer: Self-pay | Admitting: Internal Medicine

## 2013-09-16 DIAGNOSIS — M25551 Pain in right hip: Secondary | ICD-10-CM

## 2013-09-16 MED ORDER — PROPRANOLOL HCL 20 MG PO TABS: 20.0000 mg | ORAL_TABLET | ORAL | Status: DC | PRN

## 2013-09-16 NOTE — Telephone Encounter (Signed)
Pt would like results of xray done 11/10. pls call.  Pt would also like refill of propranolol (INDERAL) 20 MG tablet #30 w/ refills [facility] [place]

## 2013-09-16 NOTE — Addendum Note (Signed)
Addended by: Alfred Levins D on: 09/16/2013 02:30 PM   Modules accepted: Orders

## 2013-09-16 NOTE — Telephone Encounter (Signed)
See result note, rx sent in electronically

## 2013-09-29 ENCOUNTER — Other Ambulatory Visit: Payer: Self-pay

## 2013-09-29 MED ORDER — AMPHETAMINE-DEXTROAMPHETAMINE 20 MG PO TABS: 20.0000 mg | ORAL_TABLET | Freq: Every day | ORAL | Status: DC

## 2013-09-29 NOTE — Telephone Encounter (Signed)
Patient called requesting a refill on Adderall.  Please call the patient at [phone] when the Rx is ready for pick up.  Patient also needs to schedule appt.

## 2013-09-29 NOTE — Telephone Encounter (Signed)
I called patient and let him know his Rx will be available today for pick up after 4:30 p.m.

## 2013-10-14 ENCOUNTER — Ambulatory Visit: Payer: Managed Care, Other (non HMO) | Admitting: Sports Medicine

## 2013-10-21 ENCOUNTER — Other Ambulatory Visit: Payer: Self-pay

## 2013-10-21 MED ORDER — LEVETIRACETAM ER 750 MG PO TB24: 1500.0000 mg | ORAL_TABLET | Freq: Every evening | ORAL | Status: DC

## 2013-10-27 ENCOUNTER — Other Ambulatory Visit: Payer: Self-pay | Admitting: Internal Medicine

## 2013-11-04 ENCOUNTER — Telehealth: Payer: Self-pay | Admitting: Internal Medicine

## 2013-11-04 MED ORDER — ATORVASTATIN CALCIUM 20 MG PO TABS: ORAL_TABLET | ORAL | Status: DC

## 2013-11-04 NOTE — Telephone Encounter (Signed)
rx sent in electronically 

## 2013-11-04 NOTE — Telephone Encounter (Signed)
Pt needs  liptor 20 mg #90  [facility]

## 2013-11-05 ENCOUNTER — Ambulatory Visit (INDEPENDENT_AMBULATORY_CARE_PROVIDER_SITE_OTHER): Payer: Managed Care, Other (non HMO) | Admitting: Sports Medicine

## 2013-11-05 ENCOUNTER — Encounter: Payer: Self-pay | Admitting: Sports Medicine

## 2013-11-05 VITALS — BP 135/90 | HR 66 | Ht 72.0 in | Wt 235.0 lb

## 2013-11-05 DIAGNOSIS — M25551 Pain in right hip: Secondary | ICD-10-CM

## 2013-11-05 DIAGNOSIS — M25559 Pain in unspecified hip: Secondary | ICD-10-CM

## 2013-11-05 NOTE — Progress Notes (Signed)
CC: Right groin and thigh pain HPI: Patient is a very pleasant 48 year old male who presents for evaluation of right groin and thigh pain that has been present for several months. He states it was actually at its worst over the fall after he started playing more tennis. It then improved during December but then has returned again in the last month or so. He states that the pain is in his right groin that radiates down his right anterior thigh to the level of the knee. It seems to be worse when he has been sitting in the car and tries to get out of the car. He will be a such a severe sudden pain in his leg will buckle. At its worst it would wake him up at night sometimes with the pain. He feels like there is a catching at the area of the hip and that sometimes the hip feels out of joint. He has not really tried anything. He denies any injury at the onset of his symptoms.  He is also complaining of right shoulder pain. He travels a lot and has to get in and out of the airplane. He notes that his shoulder bothers him when he strength pulldown luggage.  ROS: As above in the HPI. All other systems are stable or negative.  PMH: Seizure disorder, dyslipidemia for which he takes Lipitor and Keppra  Social: Patient does not smoke. He drinks alcohol about 2 days per week socially. He works in [employer] for a [employer] Family: Family history is negative for diabetes and high blood pressure and positive for heart disease in his father.  Allergies: Penicillin    OBJECTIVE: APPEARANCE:  Patient in no acute distress.The patient appeared well nourished and normally developed. HEENT: No scleral icterus. Conjunctiva non-injected Resp: Non labored Skin: No rash MSK:  Right Hip exam:  - No swelling or deformity - Full range of motion in flexion, extension, internal and external rotation. - Pain reproduced with FADIR - Negative logroll - Strength is 5 out of 5 on hip flexion, abduction, adduction  without pain - Neurovascularly intact - FABERs negative   Right Shoulder - No swelling or deformity - No TTP over AC joint, biceps tendon, or lateral humerus - FROM in flexion, abduction, internal, external rotation - Strength 5/5 on shoulder abduction, internal rotation, external rotation, empty can - Negative empty can, positive pain with Hawkin's and Neer's for impingement - Labrum: Negative O'Briens - Neurovascularly intact  MSK US: Not performed  Radiographs: X-ray of the hip and pelvis were reviewed from outside facility. This shows no significant degenerative change.  ASSESSMENT: #1. Right hip pain, favor labral versus capsule #2. Right rotator cuff syndrome  PLAN: Patient was given some home exercises to maintain hip range of motion. We will try a course of oral anti-inflammatories with ibuprofen. We will see him back in 6 weeks for this problem. For his rotator cuff syndrome he was given a home exercise program to perform.

## 2013-11-05 NOTE — Patient Instructions (Signed)
Thank you for coming in today  1. Work on hip range of motion with hip swings and standing hip rotation 2. Take 4 advil 3x per day with food for 7 days then as needed 3. Low impact cardio exercise - swim, bike, elliptical, walk 4. Hold off on tennis, basketball until we see you back  For your shoulder 1. Advil as above 2. Start home exercises  Followup in 6 weeks

## 2013-11-17 ENCOUNTER — Ambulatory Visit (INDEPENDENT_AMBULATORY_CARE_PROVIDER_SITE_OTHER): Payer: Managed Care, Other (non HMO) | Admitting: Neurology

## 2013-11-17 ENCOUNTER — Encounter: Payer: Self-pay | Admitting: Neurology

## 2013-11-17 VITALS — BP 124/79 | HR 69 | Ht 73.0 in | Wt 241.0 lb

## 2013-11-17 DIAGNOSIS — R569 Unspecified convulsions: Secondary | ICD-10-CM

## 2013-11-17 MED ORDER — LEVETIRACETAM ER 750 MG PO TB24: 1500.0000 mg | ORAL_TABLET | Freq: Every evening | ORAL | Status: DC

## 2013-11-17 NOTE — Progress Notes (Signed)
    Reason for visit: Seizures  Mardee PostinRichard T Georg is an 48 y.o. male  History of present illness:  Mr. Manuel Hartman is a 48 year-old right-handed white male with a history of seizures. The patient has done well on Keppra, without recurrence of seizures since he was seen in October 2013. The patient is operating a motor vehicle without difficulty. The patient reports some issues with word finding problems at times, and some general memory issues. The patient has some mild irritability on the Keppra, but he indicates that this is not a significant issue for him. The patient denies any other significant medical issues that have come up since last seen.  Past Medical History  Diagnosis Date  . HYPERLIPIDEMIA 06/28/2007  . SEIZURE DISORDER 06/28/2007    Past Surgical History  Procedure Laterality Date  . Appendectomy  1994  . 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 Cancer Mother     gastric  . Heart disease Father [id number]    fatal mi  . Cancer Sister     breast    Social history:  reports that he has never smoked. He has never used smokeless tobacco. He reports that he drinks alcohol. He reports that he does not use illicit drugs.    Allergies  Allergen Reactions  . Penicillins     Medications:  Current Outpatient Prescriptions on File Prior to Visit  Medication Sig Dispense Refill  . amphetamine-dextroamphetamine (ADDERALL) 20 MG tablet Take 1 tablet (20 mg total) by mouth daily. States he takes irregularly  30 tablet  0  . atorvastatin (LIPITOR) 20 MG tablet TAKE 1 TABLET DAILY  90 tablet  3  . propranolol (INDERAL) 20 MG tablet Take 1 tablet (20 mg total) by mouth as needed.  30 tablet  5   No current facility-administered medications on file prior to visit.    ROS:  Out of a complete 14 system review of symptoms, the patient complains only of the following symptoms, and all other reviewed systems are negative.  History of  seizures Memory disturbance  Blood pressure 124/79, pulse 69, height 6\' 1"  (1.854 m), weight 241 lb (109.317 kg).  Physical Exam  General: The patient is alert and cooperative at the time of the examination.  Skin: No significant peripheral edema is noted.   Neurologic Exam  Mental status: The patient is oriented x 3.  Cranial nerves: Facial symmetry is present. Speech is normal, no aphasia or dysarthria is noted. Extraocular movements are full. Visual fields are full.  Motor: The patient has good strength in all 4 extremities.  Sensory examination: Soft touch sensation is symmetric on the face, arms, and legs.  Coordination: The patient has good finger-nose-finger and heel-to-shin bilaterally.  Gait and station: The patient has a normal gait. Tandem gait is normal. Romberg is negative. No drift is seen.  Reflexes: Deep tendon reflexes are symmetric.   Assessment/Plan:  1. Seizure disorder  The patient will continue the Keppra at the current dose. A prescription was sent. The patient will followup through this office in about one year, but he will contact our office if any significant issues arise.  Marlan Palau. Keith Willis MD 11/17/2013 7:50 PM  [facility] [address] [place], [place] [id number]  Phone [phone] Fax [phone]

## 2013-11-17 NOTE — Patient Instructions (Signed)
Epilepsy Epilepsy is a disorder in which a person has repeated seizures over time. A seizure is a release of abnormal electrical activity in the brain. Seizures can cause a change in attention, behavior, or the ability to remain awake and alert (altered mental status). Seizures often involve uncontrollable shaking (convulsions).  Most people with epilepsy lead normal lives. However, people with epilepsy are at an increased risk of falls, accidents, and injuries. Therefore, it is important to begin treatment right away. CAUSES  Epilepsy has many possible causes. Anything that disturbs the normal pattern of brain cell activity can lead to seizures. This may include:   Head injury.  Birth trauma.  High fever as a child.  Stroke.  Bleeding into or around the brain.  Certain drugs.  Prolonged low oxygen, such as what occurs after CPR efforts.  Abnormal brain development.  Certain illnesses, such as meningitis, encephalitis (brain infection), malaria, and other infections.  An imbalance of nerve signaling chemicals (neurotransmitters).  SIGNS AND SYMPTOMS  The symptoms of a seizure can vary greatly from one person to another. Right before a seizure, you may have a warning (aura) that a seizure is about to occur. An aura may include the following symptoms:  Fear or anxiety.  Nausea.  Feeling like the room is spinning (vertigo).  Vision changes, such as seeing flashing lights or spots. Common symptoms during a seizure include:  Abnormal sensations, such as an abnormal smell or a bitter taste in the mouth.   Sudden, general body stiffness.   Convulsions that involve rhythmic jerking of the face, arm, or leg on one or both sides.   Sudden change in consciousness.   Appearing to be awake but not responding.   Appearing to be asleep but cannot be awakened.   Grimacing, chewing, lip smacking, drooling, tongue biting, or loss of bowel or bladder control. After a seizure,  you may feel sleepy for a while. DIAGNOSIS  Your health care provider will ask about your symptoms and take a medical history. Descriptions from any witnesses to your seizures will be very helpful in the diagnosis. A physical exam, including a detailed neurological exam, is necessary. Various tests may be done, such as:   An electroencephalogram (EEG). This is a painless test of your brain waves. In this test, a diagram is created of your brain waves. These diagrams can be interpreted by a specialist.  An MRI of the brain.   A CT scan of the brain.   A spinal tap (lumbar puncture, LP).  Blood tests to check for signs of infection or abnormal blood chemistry. TREATMENT  There is no cure for epilepsy, but it is generally treatable. Once epilepsy is diagnosed, it is important to begin treatment as soon as possible. For most people with epilepsy, seizures can be controlled with medicines. The following may also be used:  A pacemaker for the brain (vagus nerve stimulator) can be used for people with seizures that are not well controlled by medicine.  Surgery on the brain. For some people, epilepsy eventually goes away. HOME CARE INSTRUCTIONS   Follow your health care provider's recommendations on driving and safety in normal activities.  Get enough rest. Lack of sleep can cause seizures.  Only take over-the-counter or prescription medicines as directed by your health care provider. Take any prescribed medicine exactly as directed.  Avoid any known triggers of your seizures.  Keep a seizure diary. Record what you recall about any seizure, especially any possible trigger.   Make   sure the people you live and work with know that you are prone to seizures. They should receive instructions on how to help you. In general, a witness to a seizure should:   Cushion your head and body.   Turn you on your side.   Avoid unnecessarily restraining you.   Not place anything inside your  mouth.   Call for emergency medical help if there is any question about what has occurred.   Follow up with your health care provider as directed. You may need regular blood tests to monitor the levels of your medicine.  SEEK MEDICAL CARE IF:   You develop signs of infection or other illness. This might increase the risk of a seizure.   You seem to be having more frequent seizures.   Your seizure pattern is changing.  SEEK IMMEDIATE MEDICAL CARE IF:   You have a seizure that does not stop after a few moments.   You have a seizure that causes any difficulty in breathing.   You have a seizure that results in a very severe headache.   You have a seizure that leaves you with the inability to speak or use a part of your body.  Document Released: 10/16/2005 Document Revised: 08/06/2013 Document Reviewed: 05/28/2013 [employer] Patient Information 2014 [employer].  

## 2013-11-21 ENCOUNTER — Ambulatory Visit: Payer: Self-pay | Admitting: Neurology

## 2013-12-09 ENCOUNTER — Other Ambulatory Visit: Payer: Self-pay

## 2013-12-09 MED ORDER — LEVETIRACETAM ER 750 MG PO TB24: 1500.0000 mg | ORAL_TABLET | Freq: Every evening | ORAL | Status: DC

## 2013-12-10 ENCOUNTER — Telehealth: Payer: Self-pay | Admitting: Internal Medicine

## 2013-12-10 MED ORDER — ATORVASTATIN CALCIUM 20 MG PO TABS: ORAL_TABLET | ORAL | Status: DC

## 2013-12-10 MED ORDER — LEVETIRACETAM ER 750 MG PO TB24: 1500.0000 mg | ORAL_TABLET | Freq: Every evening | ORAL | Status: DC

## 2013-12-10 NOTE — Telephone Encounter (Signed)
rx sent in electronically 

## 2013-12-10 NOTE — Telephone Encounter (Signed)
[facility] requesting new scripts of the following:  atorvastatin (LIPITOR) 20 MG tablet Levetiracetam 750 MG TB24

## 2014-01-13 ENCOUNTER — Ambulatory Visit: Payer: Managed Care, Other (non HMO) | Admitting: Sports Medicine

## 2014-04-28 ENCOUNTER — Other Ambulatory Visit: Payer: Self-pay | Admitting: Neurology

## 2014-04-28 MED ORDER — AMPHETAMINE-DEXTROAMPHETAMINE 20 MG PO TABS: 20.0000 mg | ORAL_TABLET | Freq: Every day | ORAL | Status: DC

## 2014-04-28 NOTE — Telephone Encounter (Signed)
Request forwarded to Fidelis Loth for approval  

## 2014-04-28 NOTE — Telephone Encounter (Signed)
Patient requesting refill of Adderall

## 2014-04-28 NOTE — Telephone Encounter (Signed)
Called pt and left message informing him that his Rx was ready to be picked up at the front desk and if there was any other problems, questions or concerns to call the office.

## 2014-08-11 ENCOUNTER — Encounter: Payer: Self-pay | Admitting: Internal Medicine

## 2014-08-11 ENCOUNTER — Ambulatory Visit (INDEPENDENT_AMBULATORY_CARE_PROVIDER_SITE_OTHER): Payer: Managed Care, Other (non HMO) | Admitting: Internal Medicine

## 2014-08-11 VITALS — BP 132/90 | HR 65 | Temp 98.2°F | Resp 20 | Ht 73.0 in | Wt 232.0 lb

## 2014-08-11 DIAGNOSIS — L0591 Pilonidal cyst without abscess: Secondary | ICD-10-CM

## 2014-08-11 DIAGNOSIS — L0592 Pilonidal sinus without abscess: Secondary | ICD-10-CM

## 2014-08-11 DIAGNOSIS — Z23 Encounter for immunization: Secondary | ICD-10-CM

## 2014-08-11 MED ORDER — CIPROFLOXACIN HCL 500 MG PO TABS: 500.0000 mg | ORAL_TABLET | Freq: Two times a day (BID) | ORAL | Status: DC

## 2014-08-11 NOTE — Progress Notes (Signed)
   Subjective:    Patient ID: Manuel Hartman, male    DOB: 11-14-1965, 48 y.o.   MRN: [id number]  HPI 48 year old patient who presents today complaining of some chronic drainage from the perineal area.  Earlier he had a more painful lesion that has improved.  Past Medical History  Diagnosis Date  . HYPERLIPIDEMIA 06/28/2007  . SEIZURE DISORDER 06/28/2007    History   Social History  . Marital Status: Married    Spouse Name: N/A    Number of Children: N/A  . Years of Education: N/A   Occupational History  . Not on file.   Social History Main Topics  . Smoking status: Never Smoker   . Smokeless tobacco: Never Used  . Alcohol Use: Yes  . Drug Use: No  . Sexual Activity: Not on file   Other Topics Concern  . Not on file   Social History Narrative  . No narrative on file    Past Surgical History  Procedure Laterality Date  . Appendectomy  1994  . 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 Cancer Mother     gastric  . Heart disease Father 4049    fatal mi  . Cancer Sister     breast    Allergies  Allergen Reactions  . Penicillins     Current Outpatient Prescriptions on File Prior to Visit  Medication Sig Dispense Refill  . amphetamine-dextroamphetamine (ADDERALL) 20 MG tablet Take 1 tablet (20 mg total) by mouth daily.  30 tablet  0  . atorvastatin (LIPITOR) 20 MG tablet TAKE 1 TABLET DAILY  90 tablet  3  . Levetiracetam 750 MG TB24 Take 2 tablets (1,500 mg total) by mouth every evening.  180 tablet  3  . propranolol (INDERAL) 20 MG tablet Take 1 tablet (20 mg total) by mouth as needed.  30 tablet  5   No current facility-administered medications on file prior to visit.    BP 132/90  Pulse 65  Temp(Src) 98.2 F (36.8 C) (Oral)  Resp 20  Ht 6\' 1"  (1.854 m)  Wt 232 lb (105.235 kg)  BMI 30.62 kg/m2  SpO2 97%     Review of Systems  Skin: Positive for wound.       Objective:   Physical Exam    Constitutional: He appears well-developed and well-nourished. No distress.  Weight 232 Blood pressure 130/90  Genitourinary:  An area of mild induration just posterior to the anal area.  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  Options discussed.  We'll try 7 days of antibiotic therapy and observe.  Patient is aware that this likely will require excision.

## 2014-08-11 NOTE — Progress Notes (Signed)
Pre visit review using our clinic review tool, if applicable. No additional management support is needed unless otherwise documented below in the visit note. 

## 2014-08-11 NOTE — Patient Instructions (Addendum)
Take your antibiotic as prescribed until ALL of it is gone, but stop if you develop a rash, swelling, or any side effects of the medication.  Contact our office as soon as possible if  there are side effects of the medication.Pilonidal Cyst, Care After A pilonidal cyst occurs when hairs get trapped (ingrown) beneath the skin in the crease between the buttocks over your sacrum (the bone under that crease). Pilonidal cysts are most common in young men with a lot of body hair. When the cyst breaks(ruptured) or leaks, fluid from the cyst may cause burning and itching. If the cyst becomes infected, it causes a painful swelling filled with pus (abscess). The pus and trapped hairs need to be removed (often by lancing) so that the infection can heal. The word pilonidal means hair nest. HOME CARE INSTRUCTIONS If the pilonidal sinus was NOT DRAINING OR LANCED:  Keep the area clean and dry. Bathe or shower daily. Wash the area well with a germ-killing soap. Hot tub baths may help prevent infection. Dry the area well with a towel.  Avoid tight clothing in order to keep area as moisture-free as possible.  Keep area between buttocks as free from hair as possible. A depilatory may be used.  Take antibiotics as directed.  Only take over-the-counter or prescription medicines for pain, discomfort, or fever as directed by your caregiver. If the cyst WAS INFECTED AND NEEDED TO BE DRAINED:  Your caregiver may have packed the wound with gauze to keep the wound open. This allows the wound to heal from the inside outward and continue to drain.  Return as directed for a wound check.  If you take tub baths or showers, repack the wound with gauze as directed following. Sponge baths are a good alternative. Sitz baths may be used three to four times a day or as directed.  If an antibiotic was ordered to fight the infection, take as directed.  Only take over-the-counter or prescription medicines for pain, discomfort, or  fever as directed by your caregiver.  If a drain was in place and removed, use sitz baths for 20 minutes 4 times per day. Clean the wound gently with mild unscented soap, pat dry, and then apply a dry dressing as directed. If you had surgery and IT WAS MARSUPIALIZED (LEFT OPEN):  Your wound was packed with gauze to keep the wound open. This allows the wound to heal from the inside outwards and continue draining. The changing of the dressing regularly also helps keep the wound clean.  Return as directed for a wound check.  If you take tub baths or showers, repack the wound with gauze as directed following. Sponge baths are a good alternative. Sitz baths can also be used. This may be done three to four times a day or as directed.  If an antibiotic was ordered to fight the infection, take as directed.  Only take over-the-counter or prescription medicines for pain, discomfort, or fever as directed by your caregiver.  If you had surgery and the wound was closed you may care for it as directed. This generally includes keeping it dry and clean and dressing it as directed. SEEK MEDICAL CARE IF:   You have increased pain, swelling, redness, drainage, or bleeding from the area.  You have a fever.  You have muscles aches, dizziness, or a general ill feeling. Document Released: 11/16/2006 Document Revised: 06/18/2013 Document Reviewed: 01/31/2007 [facility] Patient Information 2015 [place] [place]. This information is not intended to replace advice given  to you by your health care provider. Make sure you discuss any questions you have with your health care provider.   Call or return to clinic prn if these symptoms worsen or fail to improve as anticipated.  Take your antibiotic as prescribed until ALL of it is gone, but stop if you develop a rash, swelling, or any side effects of the medication.  Contact our office as soon as possible if  there are side effects of the medication.

## 2014-08-21 ENCOUNTER — Other Ambulatory Visit: Payer: Self-pay | Admitting: Neurology

## 2014-08-21 NOTE — Telephone Encounter (Signed)
Dr Willis is out of the office.  Request entered, forwarded to WID for approval.  

## 2014-08-25 MED ORDER — AMPHETAMINE-DEXTROAMPHETAMINE 20 MG PO TABS: 20.0000 mg | ORAL_TABLET | Freq: Every day | ORAL | Status: DC

## 2014-08-27 NOTE — Telephone Encounter (Signed)
I called the patient to let them know their Rx for Adderall was ready for pickup. Patient was instructed to bring Photo ID. 

## 2014-09-17 ENCOUNTER — Other Ambulatory Visit: Payer: Self-pay | Admitting: Internal Medicine

## 2014-11-05 NOTE — Telephone Encounter (Signed)
Patient never picked up his Rx of Adderall 20mg  dated 08/25/14.  The Rx is Void after 90 days and has been placed in the shred ben.

## 2014-11-06 ENCOUNTER — Ambulatory Visit: Payer: Managed Care, Other (non HMO) | Admitting: Neurology

## 2014-11-06 ENCOUNTER — Telehealth: Payer: Self-pay | Admitting: Neurology

## 2014-11-06 NOTE — Telephone Encounter (Signed)
This patient canceled just prior to appointment time.

## 2014-11-13 ENCOUNTER — Ambulatory Visit (INDEPENDENT_AMBULATORY_CARE_PROVIDER_SITE_OTHER): Payer: Managed Care, Other (non HMO) | Admitting: Neurology

## 2014-11-13 ENCOUNTER — Encounter: Payer: Self-pay | Admitting: Neurology

## 2014-11-13 VITALS — BP 121/83 | HR 69 | Ht 72.0 in | Wt 237.6 lb

## 2014-11-13 DIAGNOSIS — F909 Attention-deficit hyperactivity disorder, unspecified type: Secondary | ICD-10-CM

## 2014-11-13 DIAGNOSIS — R569 Unspecified convulsions: Secondary | ICD-10-CM

## 2014-11-13 MED ORDER — LEVETIRACETAM ER 750 MG PO TB24: 1500.0000 mg | ORAL_TABLET | Freq: Every evening | ORAL | Status: DC

## 2014-11-13 MED ORDER — AMPHETAMINE-DEXTROAMPHETAMINE 20 MG PO TABS: 20.0000 mg | ORAL_TABLET | Freq: Every day | ORAL | Status: DC

## 2014-11-13 NOTE — Progress Notes (Signed)
    Reason for visit: Seizures  Manuel PostinRichard T Hartman is an 49 y.o. male  History of present illness:  Mr. Manuel Hartman is a 49 year old right-handed white male with seizures and at all ADD. The patient is doing well in this regard, he has not had any further seizure-type events. The patient indicates that he does a lot of traveling, and he drives a car frequently during the week. He has not had any issues in this regard. He has had some recent problems with a pilonidal cyst that has required antibiotic therapy. He returns to this office for an evaluation. He has been in good health otherwise.  Past Medical History  Diagnosis Date  . HYPERLIPIDEMIA 06/28/2007  . SEIZURE DISORDER 06/28/2007    Past Surgical History  Procedure Laterality Date  . Appendectomy  1994  . 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 Cancer Mother     gastric  . Heart disease Father 2249    fatal mi  . Cancer Sister     breast    Social history:  reports that he has never smoked. He has never used smokeless tobacco. He reports that he drinks alcohol. He reports that he does not use illicit drugs.    Allergies  Allergen Reactions  . Penicillins     Medications:  Current Outpatient Prescriptions on File Prior to Visit  Medication Sig Dispense Refill  . atorvastatin (LIPITOR) 20 MG tablet TAKE 1 TABLET DAILY 90 tablet 3  . propranolol (INDERAL) 20 MG tablet TAKE 1 TABLET (20 MG TOTAL) BY MOUTH AS NEEDED. 30 tablet 0   No current facility-administered medications on file prior to visit.    ROS:  Out of a complete 14 system review of symptoms, the patient complains only of the following symptoms, and all other reviewed systems are negative.  History of seizures  Blood pressure 121/83, pulse 69, height 6' (1.829 m), weight 237 lb 9.6 oz (107.775 kg).  Physical Exam  General: The patient is alert and cooperative at the time of the examination.  Skin: No  significant peripheral edema is noted.   Neurologic Exam  Mental status: The patient is oriented x 3.  Cranial nerves: Facial symmetry is present. Speech is normal, no aphasia or dysarthria is noted. Extraocular movements are full. Visual fields are full.  Motor: The patient has good strength in all 4 extremities.  Sensory examination: Soft touch sensation is symmetric on the face, arms, and legs.   Coordination: The patient has good finger-nose-finger and heel-to-shin bilaterally.  Gait and station: The patient has a normal gait. Tandem gait is normal. Romberg is negative. No drift is seen.  Reflexes: Deep tendon reflexes are symmetric.   Assessment/Plan:  1. Seizure disorder  2. Adult ADD  The patient is doing well at this time. He was given a prescription for Adderall, and his annual prescription for Keppra was sent in to the pharmacy. He is to continue the medication at the current dose. He will follow-up in one year.    Marlan Palau. Manuel Manuel Hollibaugh MD 11/13/2014 3:03 PM  [facility] [address] [place], [place] [address]  Phone [phone] Fax [phone]

## 2014-11-13 NOTE — Patient Instructions (Signed)

## 2015-01-20 ENCOUNTER — Other Ambulatory Visit: Payer: Self-pay | Admitting: Internal Medicine

## 2015-02-26 ENCOUNTER — Other Ambulatory Visit: Payer: Self-pay | Admitting: Neurology

## 2015-02-26 MED ORDER — AMPHETAMINE-DEXTROAMPHETAMINE 20 MG PO TABS: 20.0000 mg | ORAL_TABLET | Freq: Every day | ORAL | Status: DC

## 2015-02-26 NOTE — Telephone Encounter (Signed)
Patient called requesting a refill for amphetamine-dextroamphetamine (ADDERALL) 20 MG table. Patient is aware the script will be ready for pick up within 24hrs (by Monday) unless otherwise advised by the nurse.  Patient can be reached @  [phone]

## 2015-02-26 NOTE — Telephone Encounter (Signed)
Request entered, forwarded to provider for approval.  

## 2015-04-19 ENCOUNTER — Telehealth: Payer: Self-pay | Admitting: *Deleted

## 2015-04-19 NOTE — Telephone Encounter (Signed)
Attempted to call patient regarding Lipitor. There was no answer, voice mail left

## 2015-04-19 NOTE — Telephone Encounter (Signed)
Pt called upset that his Lipitor was denied.  Pt has not been seen since February 2015 and I explained to pt that he needed labs and an OV before he could a refill.  He stated he has been on Lipitor for years and did not see the problem in getting a refill for a month.  I explained that Dr Cato Mulligan was no longer at this practice so I could not approve it under his name and the medical board requires him to be seen at least once a year.  He stated that he has an appointment to see Kandee Keen in July and just needed enough to get him through to his appt.  I told him I would call Dr Cato Mulligan to discuss.  Called Dr Cato Mulligan and per Dr Cato Mulligan okay to order labs and one month refill till he sees [place].  He ask that I keep an eye for the labs and call him back with the results.  Called pt back and informed of Dr Cato Mulligan instructions and pt stated that he lives in [place], [place] and can not come in for just labs.  Told pt I could fax order over to a lab in [place] and he refused stating that this was "riduculous and he was just going to find another practice".  Asked pt if he wanted me to cancel the appt with [facility] and he said no he would call back once he found another provider.  Nothing further needed at this time.

## 2015-04-19 NOTE — Telephone Encounter (Signed)
Pls advise.  

## 2015-04-19 NOTE — Telephone Encounter (Signed)
Spoke with patient about establishing as a new patient with Kandee Keen and obtaining a 3 week refill on his Lipitor.  States he is less than satisfied with our practice and says it has been very difficult to establish with another MD here due to the patients work schedule. He felt like 'the staff he has been dealing with are stupid'. He also thinks we should fill a 3 week supply of his Lipitor so he does not need to change medical practices completely. I spoke with Elease Hashimoto and we can fill a 3 week supply [facility] to get him enough until he can be seen and have lab work. I educated him him that he needs to keep this appointment to receive any additional medication and that he needs yearly office visits so that we can continue to refill his medications.

## 2015-04-20 NOTE — Telephone Encounter (Signed)
Spoke to patient. Made him aware MD Swords is okay with one month refill of Lipitor given lab work has been completed. Also told patient we were willing to fax order over to a lab in [place], [place] where he is currently staying. Patient still not satisfied with outcome, stating he will just see [place] in July when he comes to establish with [facility]. Educated patient around significance of Lipitor and cholesterol. Also told patient if he changes his mind to please call office back and we are willing to do our part.

## 2015-04-22 ENCOUNTER — Other Ambulatory Visit: Payer: Self-pay | Admitting: Adult Health

## 2015-04-22 DIAGNOSIS — E785 Hyperlipidemia, unspecified: Secondary | ICD-10-CM

## 2015-04-23 ENCOUNTER — Other Ambulatory Visit (INDEPENDENT_AMBULATORY_CARE_PROVIDER_SITE_OTHER): Payer: Managed Care, Other (non HMO)

## 2015-04-23 ENCOUNTER — Other Ambulatory Visit: Payer: Self-pay | Admitting: Adult Health

## 2015-04-23 DIAGNOSIS — E785 Hyperlipidemia, unspecified: Secondary | ICD-10-CM

## 2015-04-23 LAB — LIPID PANEL
CHOL/HDL RATIO: 4
Cholesterol: 178 mg/dL (ref 0–200)
HDL: 44.8 mg/dL (ref >39.00)
LDL Cholesterol: 107 mg/dL — ABNORMAL HIGH (ref 0–99)
NONHDL: 133.2
Triglycerides: 130 mg/dL (ref 0.0–149.0)
VLDL: 26 mg/dL (ref 0.0–40.0)

## 2015-04-30 MED ORDER — PROPRANOLOL HCL 20 MG PO TABS: ORAL_TABLET | ORAL | Status: DC

## 2015-04-30 MED ORDER — ATORVASTATIN CALCIUM 20 MG PO TABS: 20.0000 mg | ORAL_TABLET | Freq: Every day | ORAL | Status: DC

## 2015-04-30 NOTE — Addendum Note (Signed)
Addended by: Alfred LevinsWYRICK, CINDY D on: 04/30/2015 03:43 PM   Modules accepted: Orders

## 2015-04-30 NOTE — Telephone Encounter (Signed)
Labs came back normal per AMR CorporationCory Nafziger, AGNP.  Sent 30 day supply in that was previously okay by Dr [facility]Swords [facility] [address] Appt to est 05/24/15.  No refills given.  Refill the propranolol and atorvastatin

## 2015-05-24 ENCOUNTER — Ambulatory Visit (INDEPENDENT_AMBULATORY_CARE_PROVIDER_SITE_OTHER): Payer: Managed Care, Other (non HMO) | Admitting: Adult Health

## 2015-05-24 ENCOUNTER — Encounter: Payer: Self-pay | Admitting: Adult Health

## 2015-05-24 VITALS — BP 120/80 | Temp 98.1°F | Ht 72.0 in | Wt 237.5 lb

## 2015-05-24 DIAGNOSIS — Z7189 Other specified counseling: Secondary | ICD-10-CM

## 2015-05-24 DIAGNOSIS — E785 Hyperlipidemia, unspecified: Secondary | ICD-10-CM | POA: Diagnosis not present

## 2015-05-24 DIAGNOSIS — Z7689 Persons encountering health services in other specified circumstances: Secondary | ICD-10-CM

## 2015-05-24 MED ORDER — ATORVASTATIN CALCIUM 20 MG PO TABS: 20.0000 mg | ORAL_TABLET | Freq: Every day | ORAL | Status: DC

## 2015-05-24 NOTE — Patient Instructions (Signed)
It was great meeting you today!   I have sent in your prescription for Lipitor to the pharmacy. If you do not receive it this week, let me know and I can call in a prescription to a local pharmacy.   Good luck with the new job.    Let me know if you need anything while you are in between providers.

## 2015-05-24 NOTE — Progress Notes (Signed)
Pre visit review using our clinic review tool, if applicable. No additional management support is needed unless otherwise documented below in the visit note. 

## 2015-05-24 NOTE — Progress Notes (Signed)
HPI:  Manuel Hartman is here to establish care  Last PCP and physical:He is unsure " I will have one at my other doctor." Immunizations:UTD Diet: He eats out a lot for work.  Exercise: Does not exercise but would like to exercise more.  Colonoscopy:Never Eye: Does not go  Dentist: Goes to the dentist twice a year.   Has the following chronic problems that require follow up and concerns today:  Hyperlipidemia - He needs his Lipitor filled. States " I have been a patient here for many years, and no doctor would prescribe me my Lipitor without first seeing me. This is just a formality because I am switching offices. I am just here for a refill."   Has no other complaints.   ROS negative for unless reported above: fevers, chills,feeling poorly, unintentional weight loss, hearing or vision loss, chest pain, palpitations, leg claudication, struggling to breath,Not feeling congested in the chest, no orthopenia, no cough,no wheezing, normal appetite, no soft tissue swelling, no hemoptysis, melena, hematochezia, hematuria, falls, loc, si, or thoughts of self harm.   Past Medical History  Diagnosis Date  . HYPERLIPIDEMIA 06/28/2007  . SEIZURE DISORDER 06/28/2007    Past Surgical History  Procedure Laterality Date  . Appendectomy  1994  . 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 Cancer Mother     gastric  . Heart disease Father 21    fatal mi  . Cancer Sister     breast    History   Social History  . Marital Status: Married    Spouse Name: N/A  . Number of Children: 2  . Years of Education: MA   Occupational History  . Pharmaceuticals    Social History Main Topics  . Smoking status: Never Smoker   . Smokeless tobacco: Never Used  . Alcohol Use: Yes  . Drug Use: No  . Sexual Activity: Not on file   Other Topics Concern  . None   Social History Narrative   Patient is right handed.   Patient drinks some  caffeine daily     Current outpatient prescriptions:  .  amphetamine-dextroamphetamine (ADDERALL) 20 MG tablet, Take 1 tablet (20 mg total) by mouth daily., Disp: 30 tablet, Rfl: 0 .  atorvastatin (LIPITOR) 20 MG tablet, Take 1 tablet (20 mg total) by mouth daily., Disp: 30 tablet, Rfl: 0 .  Levetiracetam 750 MG TB24, Take 2 tablets (1,500 mg total) by mouth every evening., Disp: 180 tablet, Rfl: 3 .  propranolol (INDERAL) 20 MG tablet, TAKE 1 TABLET (20 MG TOTAL) BY MOUTH AS NEEDED., Disp: 30 tablet, Rfl: 0  EXAM:  Filed Vitals:   05/24/15 1301  BP: 120/80  Temp: 98.1 F (36.7 C)    Body mass index is 32.2 kg/(m^2).  GENERAL: vitals reviewed and listed above, alert, oriented, appears well hydrated and in no acute distress. Slightly obese around abdomen.   HEENT: atraumatic, conjunttiva clear, no obvious abnormalities on inspection of external nose and ears. Tm's visualized  NECK: Neck is soft and supple without masses, no adenopathy or thyromegaly, trachea midline, no JVD. Normal range of motion.   LUNGS: clear to auscultation bilaterally, no wheezes, rales or rhonchi, good air movement  CV: Regular rate and rhythm, normal S1/S2, no audible murmurs, gallops, or rubs. No carotid bruit and no peripheral edema.   MS: moves all extremities without noticeable abnormality. No edema noted  Abd: soft/nontender/nondistended/normal bowel sounds   Skin: warm and dry, no rash   Extremities: No clubbing, cyanosis, or edema. Capillary refill is WNL. Pulses intact bilaterally in upper and lower extremities.   Neuro: CN II-XII intact, sensation and reflexes normal throughout, 5/5 muscle strength in bilateral upper and lower extremities. Normal finger to nose. Normal rapid alternating movements.   PSYCH: pleasant and cooperative, no obvious depression or anxiety  ASSESSMENT AND PLAN:  1. Encounter to establish care - He understands that he needs to exercise and eat healthy especially  with job where he travels a lot.  - Follow up as needed   2. Hyperlipidemia - atorvastatin (LIPITOR) 20 MG tablet; Take 1 tablet (20 mg total) by mouth daily.  Dispense: 30 tablet; Refill: 1  Discussed the following assessment and plan:  -We reviewed the PMH, PSH, FH, SH, Meds and Allergies. -We provided refills for any medications we will prescribe as needed. -We addressed current concerns per orders and patient instructions. -We have asked for records for pertinent exams, studies, vaccines and notes from previous providers. -We have advised patient to follow up per instructions below.   -Patient advised to return or notify a provider immediately if symptoms worsen or persist or new concerns arise.  There are no Patient Instructions on file for this visit.   Shirline Frees, AGNP

## 2015-10-21 ENCOUNTER — Other Ambulatory Visit: Payer: Self-pay | Admitting: Internal Medicine

## 2015-10-21 ENCOUNTER — Other Ambulatory Visit: Payer: Self-pay | Admitting: Neurology

## 2015-10-21 ENCOUNTER — Telehealth: Payer: Self-pay

## 2015-10-21 MED ORDER — AMPHETAMINE-DEXTROAMPHETAMINE 20 MG PO TABS
20.0000 mg | ORAL_TABLET | Freq: Every day | ORAL | Status: DC
Start: 2015-10-21 — End: 2016-02-14

## 2015-10-21 NOTE — Telephone Encounter (Signed)
Request entered, forwarded to provider for review.  Patient has appt scheduled in Jan.

## 2015-10-21 NOTE — Telephone Encounter (Signed)
Rx ready for pick up. 

## 2015-10-21 NOTE — Telephone Encounter (Signed)
Patient is calling to get a written Rx for amphetamine-dextroamphetamine (ADDERALL) 20 MG tablet. I advised the Rx will be ready in 24 hours unless otherwise advised by the nurse. The patient says he will pick up next week. Thank you.

## 2015-11-12 ENCOUNTER — Ambulatory Visit: Payer: Managed Care, Other (non HMO) | Admitting: Neurology

## 2015-11-12 ENCOUNTER — Telehealth: Payer: Self-pay

## 2015-11-12 NOTE — Telephone Encounter (Signed)
Patient did not come for f/u appointment today.

## 2015-11-15 ENCOUNTER — Encounter: Payer: Self-pay | Admitting: Neurology

## 2015-12-22 DIAGNOSIS — Z0279 Encounter for issue of other medical certificate: Secondary | ICD-10-CM

## 2016-01-31 ENCOUNTER — Telehealth: Payer: Self-pay | Admitting: Neurology

## 2016-01-31 MED ORDER — LEVETIRACETAM ER 750 MG PO TB24: 1500.0000 mg | ORAL_TABLET | Freq: Every evening | ORAL | Status: DC

## 2016-01-31 NOTE — Telephone Encounter (Signed)
Patient called to reschedule appointment from 11/12/15 no show, patient declined Thursday 03/23/16, can only do Monday mornings or Friday's due to work. Patient request refill in the meantime of Levetiracetam 750 MG TB24, only has a few days left, [facility], [facility]

## 2016-01-31 NOTE — Telephone Encounter (Signed)
I called and spoke to Coralee NorthNina, pharmacist [phone]([employer]).   Reordered generic keppra 750mg  po (take 2 tabs at bedtime).  # 180 refill x 1.  Has appt 9/10-2015.

## 2016-02-04 ENCOUNTER — Ambulatory Visit: Payer: Managed Care, Other (non HMO) | Admitting: Cardiology

## 2016-02-04 ENCOUNTER — Ambulatory Visit (INDEPENDENT_AMBULATORY_CARE_PROVIDER_SITE_OTHER): Payer: Managed Care, Other (non HMO) | Admitting: Cardiovascular Disease

## 2016-02-04 ENCOUNTER — Encounter: Payer: Self-pay | Admitting: Cardiovascular Disease

## 2016-02-04 VITALS — BP 110/90 | HR 61 | Ht 72.0 in | Wt 238.1 lb

## 2016-02-04 DIAGNOSIS — R0789 Other chest pain: Secondary | ICD-10-CM

## 2016-02-04 DIAGNOSIS — R079 Chest pain, unspecified: Secondary | ICD-10-CM | POA: Diagnosis not present

## 2016-02-04 LAB — TROPONIN I

## 2016-02-04 MED ORDER — ASPIRIN EC 81 MG PO TBEC: 81.0000 mg | DELAYED_RELEASE_TABLET | Freq: Every day | ORAL | Status: DC

## 2016-02-04 MED ORDER — NITROGLYCERIN 0.4 MG SL SUBL
0.4000 mg | SUBLINGUAL_TABLET | SUBLINGUAL | Status: DC | PRN
Start: 2016-02-04 — End: 2018-07-02

## 2016-02-04 NOTE — Progress Notes (Signed)
Cardiology Office Note   Date:  02/04/2016   ID:  Manuel Hartman, DOB [birth year], MRN [id number]  PCP:  Manuel Kayser, MD  Cardiologist:   Manuel Mixer, MD   Chief Complaint  Patient presents with  . Chest Pain    History of Present Illness: Manuel Hartman is a 50 y.o. male who presents for chest pain . Very strong hx of CAD - father and uncle died at age 27 due to MI  Is stress - working lots.  Has had some mid sternal pain  .  While driving  Yesterday - off and on all day. Pains would last for several minutes, resolve, then return  Occurred while sitting , No radiation, no sweats, no dyspnea  twinging pain  , not sharp Did some yard work this past week - got worn out but had no CP.  Does Manufacturing systems engineer.  Sells opioids  Does not exercise   Has not had any pains like this previously    Past Medical History  Diagnosis Date  . HYPERLIPIDEMIA 06/28/2007  . SEIZURE DISORDER 06/28/2007    Past Surgical History  Procedure Laterality Date  . Appendectomy  1994  . 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 amphetamine-dextroamphetamine (ADDERALL) 20 MG tablet Take 1 tablet (20 mg total) by mouth daily. 30 tablet 0  . atorvastatin (LIPITOR) 20 MG tablet Take 1 tablet (20 mg total) by mouth daily. 30 tablet 1  . Levetiracetam 750 MG TB24 Take 2 tablets (1,500 mg total) by mouth every evening. 180 tablet 1  . propranolol (INDERAL) 20 MG tablet TAKE 1 TABLET (20 MG TOTAL) BY MOUTH AS NEEDED. 30 tablet 5   No current facility-administered medications for this visit.    Allergies:   Penicillins    Social History:  The patient  reports that he has never smoked. He has never used smokeless tobacco. He reports that he drinks alcohol. He reports that he does not use illicit drugs.   Family History:  The patient's family history includes Cancer in his mother and sister; Coronary artery  disease (age of onset: 37) in his paternal uncle; Heart disease (age of onset: 26) in his father.    ROS:  Please see the history of present illness.    Review of Systems: Constitutional:  denies fever, chills, diaphoresis, appetite change and fatigue.  HEENT: denies photophobia, eye pain, redness, hearing loss, ear pain, congestion, sore throat, rhinorrhea, sneezing, neck pain, neck stiffness and tinnitus.  Respiratory: denies SOB, DOE, cough, chest tightness, and wheezing.  Cardiovascular: denies chest pain, palpitations and leg swelling.  Gastrointestinal: denies nausea, vomiting, abdominal pain, diarrhea, constipation, blood in stool.  Genitourinary: denies dysuria, urgency, frequency, hematuria, flank pain and difficulty urinating.  Musculoskeletal: denies  myalgias, back pain, joint swelling, arthralgias and gait problem.   Skin: denies pallor, rash and wound.  Neurological: denies dizziness, seizures, syncope, weakness, light-headedness, numbness and headaches.   Hematological: denies adenopathy, easy bruising, personal or family bleeding history.  Psychiatric/ Behavioral: denies suicidal ideation, mood changes, confusion, nervousness, sleep disturbance and agitation.       All other systems are reviewed and negative.    PHYSICAL EXAM: VS:  BP 110/90 mmHg  Pulse 61  Ht 6' (1.829 m)  Wt 238 lb 1.9 oz (108.011 kg)  BMI 32.29 kg/m2 , BMI Body mass index is 32.29 kg/(m^2). GEN: Well  nourished, well developed, in no acute distress HEENT: normal Neck: no JVD, carotid bruits, or masses Cardiac: RRR; no murmurs, rubs, or gallops,no edema  Respiratory:  clear to auscultation bilaterally, normal work of breathing GI: soft, nontender, nondistended, + BS MS: no deformity or atrophy Skin: warm and dry, no rash Neuro:  Strength and sensation are intact Psych: normal   EKG:  EKG is ordered today. The ekg ordered today demonstrates  NSR at 61.    Recent Labs: No results found  for requested labs within last 365 days.    Lipid Panel    Component Value Date/Time   CHOL 178 04/23/2015 0951   TRIG 130.0 04/23/2015 0951   HDL 44.80 04/23/2015 0951   CHOLHDL 4 04/23/2015 0951   VLDL 26.0 04/23/2015 0951   LDLCALC 107* 04/23/2015 0951      Wt Readings from Last 3 Encounters:  02/04/16 238 lb 1.9 oz (108.011 kg)  05/24/15 237 lb 8 oz (107.729 kg)  11/13/14 237 lb 9.6 oz (107.775 kg)      Other studies Reviewed: Additional studies/ records that were reviewed today include: . Review of the above records demonstrates:   ASSESSMENT AND PLAN:  1.  Chest pain  Very strong family hx of CAD.   Father Died at age 51 due to a his second heart attack. Manuel Hartman has a history of hyperlipidemia but it is fairly well-controlled. His atorvastatin was also recently increased.  He presents with some chest pain that occurred off and on for most of the day yesterday. It was described as a tightness. He does not have any pain today.  His EKG is basically normal but showed very minimal ST segment elevation in the inferior  Leads ( less than 1/4 mm /)  which was present in an EKG in 2008. STAT Troponin was ordered and is already resulted - normal  ( < 0.03)  Will get an exercise myoview. Start ASA 81 Will give script for SL NTG.  I've advised him to call 911 immediately if he has any more chest pain .    Will see him back in 3 months .  Sooner if needed     Current medicines are reviewed at length with the patient today.  The patient does not have concerns regarding medicines.   The following changes have been made:  no change  Labs/ tests ordered today include:  No orders of the defined types were placed in this encounter.     Disposition:   FU with me in 3 months      Hartman, Manuel PingPhilip J, MD  02/04/2016 9:14 AM    [facility] [address], [place], [place]  [address] Phone: [phone]; Fax: [phone]   [facility]  [address] [place], [place]  [address] [phone]   Fax [phone]

## 2016-02-04 NOTE — Patient Instructions (Addendum)
Medication Instructions:  Your physician has recommended you make the following change in your medication:  1-START Aspirin 81 mg by mouth daily 2-START Nitroglyrin (NTG) 0.4 mg under tongue as need for chest pain.  Take 1 NTG, under your tongue, while sitting. If no relief of pain may repeat NTG, one tab every 5 minutes up to 3 tablets total over 15 minutes. If no relief CALL 911. If you have dizziness/lightheadness while taking NTG, stop taking and call 911.  Labwork: Your physician recommends that you have lab work today- troponin.  Testing/Procedures: Your physician has requested that you have en exercise stress myoview. For further information please visit [email or web address]. Please follow instruction sheet, as given.  Follow-Up: Your physician wants you to follow-up in: 3 months with Dr. Elease HashimotoNahser.   If you need a refill on your cardiac medications before your next appointment, please call your pharmacy.

## 2016-02-08 ENCOUNTER — Telehealth (HOSPITAL_COMMUNITY): Payer: Self-pay | Admitting: *Deleted

## 2016-02-08 NOTE — Telephone Encounter (Signed)
Patient given detailed instructions per Myocardial Perfusion Study Information Sheet for the test on 02/10/16 at 0730. Patient notified to arrive 15 minutes early and that it is imperative to arrive on time for appointment to keep from having the test rescheduled.  If you need to cancel or reschedule your appointment, please call the office within 24 hours of your appointment. Failure to do so may result in a cancellation of your appointment, and a $50 no show fee. Patient verbalized understanding.Basia Mcginty W    

## 2016-02-10 ENCOUNTER — Ambulatory Visit (HOSPITAL_COMMUNITY): Payer: Managed Care, Other (non HMO) | Attending: Cardiovascular Disease

## 2016-02-10 DIAGNOSIS — Z8249 Family history of ischemic heart disease and other diseases of the circulatory system: Secondary | ICD-10-CM | POA: Diagnosis not present

## 2016-02-10 DIAGNOSIS — R079 Chest pain, unspecified: Secondary | ICD-10-CM | POA: Insufficient documentation

## 2016-02-10 LAB — MYOCARDIAL PERFUSION IMAGING
CHL CUP MPHR: 170 {beats}/min
CHL CUP RESTING HR STRESS: 56 {beats}/min
CSEPEDS: 15 s
CSEPEW: 12.1 METS
CSEPHR: 95 %
CSEPPHR: 162 {beats}/min
Exercise duration (min): 10 min
LV dias vol: 107 mL (ref 62–150)
LV sys vol: 47 mL
RATE: 0.34
RPE: 18
SDS: 2
SRS: 0
SSS: 2
TID: 0.89

## 2016-02-10 MED ORDER — TECHNETIUM TC 99M SESTAMIBI GENERIC - CARDIOLITE
10.5000 | Freq: Once | INTRAVENOUS | Status: AC | PRN
  Administered 2016-02-10: 11 via INTRAVENOUS

## 2016-02-10 MED ORDER — TECHNETIUM TC 99M SESTAMIBI GENERIC - CARDIOLITE
32.8000 | Freq: Once | INTRAVENOUS | Status: AC | PRN
  Administered 2016-02-10: 32.8 via INTRAVENOUS

## 2016-02-14 ENCOUNTER — Ambulatory Visit (INDEPENDENT_AMBULATORY_CARE_PROVIDER_SITE_OTHER): Payer: Managed Care, Other (non HMO) | Admitting: Neurology

## 2016-02-14 ENCOUNTER — Telehealth: Payer: Self-pay

## 2016-02-14 ENCOUNTER — Ambulatory Visit: Payer: Self-pay | Admitting: Neurology

## 2016-02-14 ENCOUNTER — Encounter: Payer: Self-pay | Admitting: Neurology

## 2016-02-14 VITALS — BP 119/90 | HR 59 | Ht 72.0 in | Wt 242.5 lb

## 2016-02-14 DIAGNOSIS — F909 Attention-deficit hyperactivity disorder, unspecified type: Secondary | ICD-10-CM

## 2016-02-14 DIAGNOSIS — R569 Unspecified convulsions: Secondary | ICD-10-CM

## 2016-02-14 MED ORDER — AMPHETAMINE-DEXTROAMPHETAMINE 20 MG PO TABS: 20.0000 mg | ORAL_TABLET | Freq: Every day | ORAL | Status: DC

## 2016-02-14 NOTE — Progress Notes (Signed)
Reason for visit: Seizures  Manuel Hartman is an 50 y.o. male  History of present illness:  Manuel Hartman is a 50 year old right-handed white male with a history of seizures that have been well controlled on Keppra XR taking 1500 mg in the evening. The patient is doing well with this, he has not had any seizures since last seen. He is under some stress with his job, he occasionally will take Adderall to help him focus. He has recently had some chest pain and a cardiac evaluation has included a stress test. He is tolerating the Keppra well, this does not result in any irritability or sleepiness during the day. He follows up for an evaluation.  Past Medical History  Diagnosis Date  . HYPERLIPIDEMIA 06/28/2007  . SEIZURE DISORDER 06/28/2007    Past Surgical History  Procedure Laterality Date  . Appendectomy  1994  . 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 Cancer Mother     gastric  . Heart disease Father 79    fatal mi  . Cancer Sister     breast  . Coronary artery disease Paternal Uncle 54    Social history:  reports that he has never smoked. He has never used smokeless tobacco. He reports that he drinks alcohol. He reports that he does not use illicit drugs.    Allergies  Allergen Reactions  . Penicillins     Medications:  Prior to Admission medications   Medication Sig Start Date End Date Taking? Authorizing Provider  amphetamine-dextroamphetamine (ADDERALL) 20 MG tablet Take 1 tablet (20 mg total) by mouth daily. 10/21/15   York Spaniel, MD  aspirin EC 81 MG tablet Take 1 tablet (81 mg total) by mouth daily. 02/04/16   Vesta Mixer, MD  atorvastatin (LIPITOR) 40 MG tablet Take 40 mg by mouth daily.    Historical Provider, MD  Levetiracetam 750 MG TB24 Take 2 tablets (1,500 mg total) by mouth every evening. 01/31/16   York Spaniel, MD  nitroGLYCERIN (NITROSTAT) 0.4 MG SL tablet Place 1 tablet (0.4 mg total)  under the tongue every 5 (five) minutes as needed for chest pain. 02/04/16   Vesta Mixer, MD  propranolol (INDERAL) 20 MG tablet TAKE 1 TABLET (20 MG TOTAL) BY MOUTH AS NEEDED. 10/21/15   Shirline Frees, NP    ROS:  Out of a complete 14 system review of symptoms, the patient complains only of the following symptoms, and all other reviewed systems are negative.  History of seizures  Blood pressure 119/90, pulse 59, height 6' (1.829 m), weight 242 lb 8 oz (109.997 kg).  Physical Exam  General: The patient is alert and cooperative at the time of the examination.  Skin: No significant peripheral edema is noted.   Neurologic Exam  Mental status: The patient is alert and oriented x 3 at the time of the examination. The patient has apparent normal recent and remote memory, with an apparently normal attention span and concentration ability.   Cranial nerves: Facial symmetry is present. Speech is normal, no aphasia or dysarthria is noted. Extraocular movements are full. Visual fields are full.  Motor: The patient has good strength in all 4 extremities.  Sensory examination: Soft touch sensation is symmetric on the face, arms, and legs.  Coordination: The patient has good finger-nose-finger and heel-to-shin bilaterally.  Gait and station: The patient has a normal gait.  Tandem gait is normal. Romberg is negative. No drift is seen.  Reflexes: Deep tendon reflexes are symmetric.   Assessment/Plan:  1. History of seizures  The patient is well controlled, he will continue on the Keppra, a prescription for Adderall was given today, he will follow-up in one year, sooner if needed.  Marlan Palau. Keith Isidor Bromell MD 02/14/2016 9:29 AM  [facility] [address] [place], [place] [id number]  Phone [phone] Fax [phone]

## 2016-02-14 NOTE — Telephone Encounter (Signed)
Spoke to pt d/t being on wait list for f/up appt. Reports that he is on his way out of town but says he can be here between 9 & 9:15. Placed on schedule.

## 2016-02-16 ENCOUNTER — Telehealth: Payer: Self-pay | Admitting: Nurse Practitioner

## 2016-02-16 NOTE — Telephone Encounter (Signed)
Per Dr. Elease HashimotoNahser  Normal myoview      Normal EF         ----- Message -----     From: Levi AlandMichelle M Jayton Popelka, RN     Sent: 02/15/2016 11:08 AM      To: Vesta MixerPhilip J Nahser, MD    Left message for patient to call office for results.

## 2016-02-22 NOTE — Telephone Encounter (Signed)
Left detailed message of normal results on voice mail and advised him to call back with questions or concerns

## 2016-06-30 ENCOUNTER — Ambulatory Visit: Payer: Managed Care, Other (non HMO) | Admitting: Neurology

## 2016-08-17 ENCOUNTER — Other Ambulatory Visit: Payer: Self-pay | Admitting: Neurology

## 2016-08-17 NOTE — Telephone Encounter (Signed)
[facility] [phone] called to request refill of Levetiracetam 750 MG TB24.

## 2016-08-17 NOTE — Telephone Encounter (Signed)
Records show this [facility].

## 2016-12-19 ENCOUNTER — Encounter: Payer: Self-pay | Admitting: Adult Health

## 2016-12-19 ENCOUNTER — Ambulatory Visit (INDEPENDENT_AMBULATORY_CARE_PROVIDER_SITE_OTHER): Payer: Managed Care, Other (non HMO) | Admitting: Adult Health

## 2016-12-19 VITALS — BP 125/86 | HR 58 | Ht 72.0 in | Wt 244.2 lb

## 2016-12-19 DIAGNOSIS — R569 Unspecified convulsions: Secondary | ICD-10-CM

## 2016-12-19 DIAGNOSIS — F909 Attention-deficit hyperactivity disorder, unspecified type: Secondary | ICD-10-CM

## 2016-12-19 MED ORDER — AMPHETAMINE-DEXTROAMPHETAMINE 20 MG PO TABS
20.0000 mg | ORAL_TABLET | Freq: Every day | ORAL | 0 refills | Status: DC
Start: 2016-12-19 — End: 2018-07-02

## 2016-12-19 MED ORDER — LEVETIRACETAM ER 750 MG PO TB24: 1500.0000 mg | ORAL_TABLET | Freq: Every evening | ORAL | 4 refills | Status: DC

## 2016-12-19 NOTE — Patient Instructions (Signed)
Continue Keppra Blood work today If you have any seizure events please let us know.  Continue Adderall as needed

## 2016-12-19 NOTE — Progress Notes (Signed)
I have read the note, and I agree with the clinical assessment and plan.  WILLIS,CHARLES KEITH   

## 2016-12-19 NOTE — Progress Notes (Signed)
PATIENT: Manuel PostinRichard T Mierzwa DOB: 05-16-1966  REASON FOR VISIT: follow up HISTORY FROM: patient  HISTORY OF PRESENT ILLNESS: Manuel Hartman is a 51 year old male with a history of seizures and attention deficit disorder. He returns today for an evaluation. He has been on 1500 mg of Keppra XR and tolerating it well. Denies any seizure events. Denies any changes in his mood or behavior. No change in his gait or balance. He operates a Librarian, academicmotor vehicle without difficulty. He is able to complete all ADLs independently. Denies any trouble sleeping. He states that he was recently laid off from his job. He is currently trying to gain employment at other places. He denies any new neurological symptoms. Returns today for an evaluation.  HISTORY 02/14/16: Manuel Hartman is a 51 year old right-handed white male with a history of seizures that have been well controlled on Keppra XR taking 1500 mg in the evening. The patient is doing well with this, he has not had any seizures since last seen. He is under some stress with his job, he occasionally will take Adderall to help him focus. He has recently had some chest pain and a cardiac evaluation has included a stress test. He is tolerating the Keppra well, this does not result in any irritability or sleepiness during the day. He follows up for an evaluation.   REVIEW OF SYSTEMS: Out of a complete 14 system review of symptoms, the patient complains only of the following symptoms, and all other reviewed systems are negative.  See history of present illness  ALLERGIES: Allergies  Allergen Reactions  . Penicillins     HOME MEDICATIONS: Outpatient Medications Prior to Visit  Medication Sig Dispense Refill  . amphetamine-dextroamphetamine (ADDERALL) 20 MG tablet Take 1 tablet (20 mg total) by mouth daily. 30 tablet 0  . aspirin EC 81 MG tablet Take 1 tablet (81 mg total) by mouth daily. 90 tablet 3  . atorvastatin (LIPITOR) 40 MG tablet Take 40 mg by mouth daily.    .  Levetiracetam 750 MG TB24 TAKE 2 TABLETS BY MOUTH EVERY EVENING 180 tablet 1  . nitroGLYCERIN (NITROSTAT) 0.4 MG SL tablet Place 1 tablet (0.4 mg total) under the tongue every 5 (five) minutes as needed for chest pain. 25 tablet 3  . propranolol (INDERAL) 20 MG tablet TAKE 1 TABLET (20 MG TOTAL) BY MOUTH AS NEEDED. 30 tablet 5   No facility-administered medications prior to visit.     PAST MEDICAL HISTORY: Past Medical History:  Diagnosis Date  . HYPERLIPIDEMIA 06/28/2007  . SEIZURE DISORDER 06/28/2007    PAST SURGICAL HISTORY: Past Surgical History:  Procedure Laterality Date  . APPENDECTOMY  1994  . 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 Cancer Mother     gastric  . Heart disease Father 8049    fatal mi  . Cancer Sister     breast  . Coronary artery disease Paternal Uncle 5749    SOCIAL HISTORY: Social History   Social History  . Marital status: Married    Spouse name: N/A  . Number of children: 2  . Years of education: MA   Occupational History  . [employer]    Social History Main Topics  . Smoking status: Never Smoker  . Smokeless tobacco: Never Used  . Alcohol use Yes  . Drug use: No  . Sexual activity: Not on file   Other Topics Concern  .  Not on file   Social History Narrative   Lives at home w/ wife and 2 children.   Patient is right handed.   Patient drinks some caffeine daily      PHYSICAL EXAM  Vitals:   12/19/16 1245  BP: 125/86  Pulse: (!) 58  Weight: 244 lb 3.2 oz (110.8 kg)  Height: 6' (1.829 m)   Body mass index is 33.12 kg/m.  Generalized: Well developed, in no acute distress   Neurological examination  Mentation: Alert oriented to time, place, history taking. Follows all commands speech and language fluent Cranial nerve II-XII: Pupils were equal round reactive to light. Extraocular movements were full, visual field were full on confrontational test.  Facial sensation and strength were normal. Uvula tongue midline. Head turning and shoulder shrug  were normal and symmetric. Motor: The motor testing reveals 5 over 5 strength of all 4 extremities. Good symmetric motor tone is noted throughout.  Sensory: Sensory testing is intact to soft touch on all 4 extremities. No evidence of extinction is noted.  Coordination: Cerebellar testing reveals good finger-nose-finger and heel-to-shin bilaterally.  Gait and station: Gait is normal. Tandem gait is normal. Romberg is negative. No drift is seen.  Reflexes: Deep tendon reflexes are symmetric and normal bilaterally.   DIAGNOSTIC DATA (LABS, IMAGING, TESTING) - I reviewed patient records, labs, notes, testing and imaging myself where available.  Lab Results  Component Value Date   WBC 4.6 08/28/2013   HGB 13.8 08/28/2013   HCT 40.2 08/28/2013   MCV 89.6 08/28/2013   PLT 193.0 08/28/2013      Component Value Date/Time   NA 139 08/28/2013 0912   K 4.6 08/28/2013 0912   CL 105 08/28/2013 0912   CO2 25 08/28/2013 0912   GLUCOSE 93 08/28/2013 0912   BUN 15 08/28/2013 0912   CREATININE 1.0 08/28/2013 0912   CALCIUM 9.2 08/28/2013 0912   PROT 7.4 08/28/2013 0912   ALBUMIN 4.1 08/28/2013 0912   AST 20 08/28/2013 0912   ALT 30 08/28/2013 0912   ALKPHOS 53 08/28/2013 0912   BILITOT 0.6 08/28/2013 0912   GFRNONAA 109.57 05/26/2010 0816   GFRAA 106 06/19/2007 1040   Lab Results  Component Value Date   CHOL 178 04/23/2015   HDL 44.80 04/23/2015   LDLCALC 107 (H) 04/23/2015   TRIG 130.0 04/23/2015   CHOLHDL 4 04/23/2015       ASSESSMENT AND PLAN 51 y.o. year old male  has a past medical history of HYPERLIPIDEMIA (06/28/2007) and SEIZURE DISORDER (06/28/2007). here with:  1. Seizures 2. Attention deficit disorder  Overall the patient is doing well. He will continue on Keppra XR are 1500 mg daily. I will also refill Adderall 20 mg daily as needed. Patient advised that if his symptoms  worsen or he develops new symptoms he should let us know. He will follow-up in one year or sooner if needed.    Butch Penny, MSN, NP-C 12/19/2016, 1:03 PM [facility] [address], [address] [place], [place] [address] [phone]

## 2016-12-26 ENCOUNTER — Other Ambulatory Visit: Payer: Self-pay | Admitting: Neurology

## 2016-12-26 NOTE — Telephone Encounter (Signed)
Called and spoke to pt. Rx was sent to [facility] for qty 180, 4 refills by MM,NP on 12/19/16. [facility] He will call with any further questions/concerns.

## 2017-02-12 ENCOUNTER — Ambulatory Visit: Payer: Managed Care, Other (non HMO) | Admitting: Adult Health

## 2017-07-10 ENCOUNTER — Telehealth: Payer: Self-pay | Admitting: *Deleted

## 2017-07-10 NOTE — Telephone Encounter (Signed)
Rx Adderall printed on 10/21/15 destroyed d/t patient never picking up from the office.

## 2017-08-09 ENCOUNTER — Other Ambulatory Visit: Payer: Self-pay

## 2017-08-09 MED ORDER — LEVETIRACETAM ER 750 MG PO TB24
1500.0000 mg | ORAL_TABLET | Freq: Every evening | ORAL | 1 refills | Status: DC
Start: 2017-08-09 — End: 2018-02-26

## 2017-12-16 ENCOUNTER — Encounter: Payer: Self-pay | Admitting: Adult Health

## 2017-12-17 ENCOUNTER — Telehealth: Payer: Self-pay | Admitting: *Deleted

## 2017-12-17 NOTE — Telephone Encounter (Signed)
Pt stated that had cologuard in 2018 negative results, as well as flu vaccine in 08/2017 at [facility].

## 2017-12-17 NOTE — Telephone Encounter (Signed)
I have noted that per pt that he has had the flu shot 08/2017 at [facility] and had Cologuard 2018 (colon screening) which he states was negative.

## 2017-12-20 ENCOUNTER — Ambulatory Visit: Payer: Managed Care, Other (non HMO) | Admitting: Adult Health

## 2018-02-26 ENCOUNTER — Other Ambulatory Visit: Payer: Self-pay | Admitting: Neurology

## 2018-04-02 ENCOUNTER — Ambulatory Visit: Payer: Managed Care, Other (non HMO) | Admitting: Adult Health

## 2018-05-22 ENCOUNTER — Other Ambulatory Visit: Payer: Self-pay | Admitting: Adult Health

## 2018-07-02 ENCOUNTER — Ambulatory Visit: Payer: 59 | Admitting: Adult Health

## 2018-07-02 ENCOUNTER — Encounter: Payer: Self-pay | Admitting: Adult Health

## 2018-07-02 VITALS — BP 122/78 | HR 76 | Ht 72.0 in | Wt 240.0 lb

## 2018-07-02 DIAGNOSIS — R569 Unspecified convulsions: Secondary | ICD-10-CM

## 2018-07-02 DIAGNOSIS — F909 Attention-deficit hyperactivity disorder, unspecified type: Secondary | ICD-10-CM

## 2018-07-02 MED ORDER — AMPHETAMINE-DEXTROAMPHETAMINE 20 MG PO TABS: 20.0000 mg | ORAL_TABLET | Freq: Every day | ORAL | 0 refills | Status: DC

## 2018-07-02 MED ORDER — LEVETIRACETAM ER 750 MG PO TB24: 1500.0000 mg | ORAL_TABLET | Freq: Every evening | ORAL | 3 refills | Status: DC

## 2018-07-02 NOTE — Patient Instructions (Signed)
Your Plan:  Continue Keppra  Adderall refilled If your symptoms worsen or you develop new symptoms please let us know.    Thank you for coming to see [place] at [facility]. I hope we have been able to provide you high quality care today.  You may receive a patient satisfaction survey over the next few weeks. We would appreciate your feedback and comments so that we may continue to improve ourselves and the health of our patients.

## 2018-07-02 NOTE — Progress Notes (Signed)
PATIENT: Manuel Hartman DOB: 22-Mar-1966  REASON FOR VISIT: follow up HISTORY FROM: patient  HISTORY OF PRESENT ILLNESS: Today 07/02/18:  Mr. Manuel Hartman is a 52 year old male with a history of seizures and attention deficit disorder.  He returns today for follow-up.  He reports that he has been doing well.  Denies any seizure events.  Continues on Keppra  XR 1500 mg daily.  He states that he uses Adderall primarily when he has a stressful project.  He reports that he just recently was reemployed within the last month.  He is wanting a refill on his Adderall.  He returns today for an evaluation.    HISTORY [name] is a 52 year old male with a history of seizures and attention deficit disorder. He returns today for an evaluation. He has been on 1500 mg of Keppra XR and tolerating it well. Denies any seizure events. Denies any changes in his mood or behavior. No change in his gait or balance. He operates a Librarian, academic without difficulty. He is able to complete all ADLs independently. Denies any trouble sleeping. He states that he was recently laid off from his job. He is currently trying to gain employment at other places. He denies any new neurological symptoms. Returns today for an evaluation.   REVIEW OF SYSTEMS: Out of a complete 14 system review of symptoms, the patient complains only of the following symptoms, and all other reviewed systems are negative.  See HPI   ALLERGIES: Allergies  Allergen Reactions  . Penicillins     HOME MEDICATIONS: Outpatient Medications Prior to Visit  Medication Sig Dispense Refill  . amphetamine-dextroamphetamine (ADDERALL) 20 MG tablet Take 1 tablet (20 mg total) by mouth daily. 30 tablet 0  . atorvastatin (LIPITOR) 40 MG tablet Take 40 mg by mouth daily.    . Levetiracetam 750 MG TB24 Take 2 tablets (1,500 mg total) by mouth every evening. Must keep pending appt. in June before medication can be refilled again. 180 tablet 0  . propranolol  (INDERAL) 20 MG tablet TAKE 1 TABLET (20 MG TOTAL) BY MOUTH AS NEEDED. 30 tablet 5  . aspirin EC 81 MG tablet Take 1 tablet (81 mg total) by mouth daily. 90 tablet 3  . nitroGLYCERIN (NITROSTAT) 0.4 MG SL tablet Place 1 tablet (0.4 mg total) under the tongue every 5 (five) minutes as needed for chest pain. 25 tablet 3   No facility-administered medications prior to visit.     PAST MEDICAL HISTORY: Past Medical History:  Diagnosis Date  . HYPERLIPIDEMIA 06/28/2007  . SEIZURE DISORDER 06/28/2007    PAST SURGICAL HISTORY: Past Surgical History:  Procedure Laterality Date  . APPENDECTOMY  1994  . 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 Cancer Mother        gastric  . Heart disease Father 95       fatal mi  . Cancer Sister        breast  . Coronary artery disease Paternal Uncle 79    SOCIAL HISTORY: Social History   Socioeconomic History  . Marital status: Married    Spouse name: Not on file  . Number of children: 2  . Years of education: MA  . Highest education level: Not on file  Occupational History  . Occupation: Warehouse manager  . Financial resource strain: Not on file  . Food insecurity:  Worry: Not on file    Inability: Not on file  . Transportation needs:    Medical: Not on file    Non-medical: Not on file  Tobacco Use  . Smoking status: Never Smoker  . Smokeless tobacco: Never Used  Substance and Sexual Activity  . Alcohol use: Yes  . Drug use: No  . Sexual activity: Not on file  Lifestyle  . Physical activity:    Days per week: Not on file    Minutes per session: Not on file  . Stress: Not on file  Relationships  . Social connections:    Talks on phone: Not on file    Gets together: Not on file    Attends religious service: Not on file    Active member of club or organization: Not on file    Attends meetings of clubs or organizations: Not on file     Relationship status: Not on file  . Intimate partner violence:    Fear of current or ex partner: Not on file    Emotionally abused: Not on file    Physically abused: Not on file    Forced sexual activity: Not on file  Other Topics Concern  . Not on file  Social History Narrative   Lives at home w/ wife and 2 children.   Patient is right handed.   Patient drinks some caffeine daily      PHYSICAL EXAM  Vitals:   07/02/18 0906  BP: 122/78  Pulse: 76  Weight: 240 lb (108.9 kg)  Height: 6' (1.829 m)   Body mass index is 32.55 kg/m.  Generalized: Well developed, in no acute distress   Neurological examination  Mentation: Alert oriented to time, place, history taking. Follows all commands speech and language fluent Cranial nerve II-XII: Pupils were equal round reactive to light. Extraocular movements were full, visual field were full on confrontational test. Facial sensation and strength were normal. Uvula tongue midline. Head turning and shoulder shrug  were normal and symmetric. Motor: The motor testing reveals 5 over 5 strength of all 4 extremities. Good symmetric motor tone is noted throughout.  Sensory: Sensory testing is intact to soft touch on all 4 extremities. No evidence of extinction is noted.  Coordination: Cerebellar testing reveals good finger-nose-finger and heel-to-shin bilaterally.  Gait and station: Gait is normal. Reflexes: Deep tendon reflexes are symmetric and normal bilaterally.   DIAGNOSTIC DATA (LABS, IMAGING, TESTING) - I reviewed patient records, labs, notes, testing and imaging myself where available.  Lab Results  Component Value Date   WBC 4.6 08/28/2013   HGB 13.8 08/28/2013   HCT 40.2 08/28/2013   MCV 89.6 08/28/2013   PLT 193.0 08/28/2013      Component Value Date/Time   NA 139 08/28/2013 0912   K 4.6 08/28/2013 0912   CL 105 08/28/2013 0912   CO2 25 08/28/2013 0912   GLUCOSE 93 08/28/2013 0912   BUN 15 08/28/2013 0912   CREATININE  1.0 08/28/2013 0912   CALCIUM 9.2 08/28/2013 0912   PROT 7.4 08/28/2013 0912   ALBUMIN 4.1 08/28/2013 0912   AST 20 08/28/2013 0912   ALT 30 08/28/2013 0912   ALKPHOS 53 08/28/2013 0912   BILITOT 0.6 08/28/2013 0912   GFRNONAA 109.57 05/26/2010 0816   GFRAA 106 06/19/2007 1040   Lab Results  Component Value Date   CHOL 178 04/23/2015   HDL 44.80 04/23/2015   LDLCALC 107 (H) 04/23/2015   TRIG 130.0 04/23/2015   CHOLHDL 4  04/23/2015   Lab Results  Component Value Date   TSH 2.30 08/28/2013      ASSESSMENT AND PLAN 52 y.o. year old male  has a past medical history of HYPERLIPIDEMIA (06/28/2007) and SEIZURE DISORDER (06/28/2007). here with:  1.  Seizures 2.  Attention deficit disorder  The patient will continue on Keppra XR 1500 mg daily.  If his symptoms worsen or he develops new symptoms he should let us know.  I will refill Adderall today.  I checked the material in a drug registry and his last refill was from February from our office.  He is advised that if his symptoms worsen or he develops new symptoms he should let us know.  He will follow-up in 6 months or sooner if needed.     Butch Penny, MSN, NP-C 07/02/2018, 9:22 AM [facility] [address], [address] [place], [place] [address] [phone]

## 2018-07-02 NOTE — Progress Notes (Signed)
I have read the note, and I agree with the clinical assessment and plan.  Manuel Hartman   

## 2018-07-06 DIAGNOSIS — Z23 Encounter for immunization: Secondary | ICD-10-CM | POA: Diagnosis not present

## 2018-10-29 DIAGNOSIS — Z0000 Encounter for general adult medical examination without abnormal findings: Secondary | ICD-10-CM | POA: Diagnosis not present

## 2018-10-29 DIAGNOSIS — M109 Gout, unspecified: Secondary | ICD-10-CM | POA: Diagnosis not present

## 2018-10-29 DIAGNOSIS — R82998 Other abnormal findings in urine: Secondary | ICD-10-CM | POA: Diagnosis not present

## 2018-10-31 DIAGNOSIS — E7849 Other hyperlipidemia: Secondary | ICD-10-CM | POA: Diagnosis not present

## 2018-10-31 DIAGNOSIS — Z125 Encounter for screening for malignant neoplasm of prostate: Secondary | ICD-10-CM | POA: Diagnosis not present

## 2018-10-31 DIAGNOSIS — M109 Gout, unspecified: Secondary | ICD-10-CM | POA: Diagnosis not present

## 2018-11-04 DIAGNOSIS — E7849 Other hyperlipidemia: Secondary | ICD-10-CM | POA: Diagnosis not present

## 2018-11-04 DIAGNOSIS — Z0000 Encounter for general adult medical examination without abnormal findings: Secondary | ICD-10-CM | POA: Diagnosis not present

## 2018-11-04 DIAGNOSIS — Z1389 Encounter for screening for other disorder: Secondary | ICD-10-CM | POA: Diagnosis not present

## 2018-11-04 DIAGNOSIS — R569 Unspecified convulsions: Secondary | ICD-10-CM | POA: Diagnosis not present

## 2018-11-04 DIAGNOSIS — M109 Gout, unspecified: Secondary | ICD-10-CM | POA: Diagnosis not present

## 2019-06-22 ENCOUNTER — Other Ambulatory Visit: Payer: Self-pay | Admitting: Adult Health

## 2019-07-09 ENCOUNTER — Other Ambulatory Visit: Payer: Self-pay

## 2019-07-09 ENCOUNTER — Ambulatory Visit: Payer: 59 | Admitting: Adult Health

## 2019-07-09 ENCOUNTER — Encounter: Payer: Self-pay | Admitting: Adult Health

## 2019-07-09 VITALS — BP 116/88 | HR 74 | Temp 96.9°F | Ht 72.0 in | Wt 239.0 lb

## 2019-07-09 DIAGNOSIS — R569 Unspecified convulsions: Secondary | ICD-10-CM

## 2019-07-09 DIAGNOSIS — F909 Attention-deficit hyperactivity disorder, unspecified type: Secondary | ICD-10-CM

## 2019-07-09 DIAGNOSIS — R413 Other amnesia: Secondary | ICD-10-CM

## 2019-07-09 MED ORDER — AMPHETAMINE-DEXTROAMPHETAMINE 20 MG PO TABS: 20.0000 mg | ORAL_TABLET | Freq: Every day | ORAL | 0 refills | Status: DC | PRN

## 2019-07-09 NOTE — Progress Notes (Signed)
PATIENT: Manuel PostinRichard T Siller DOB: 01/15/1966  REASON FOR VISIT: follow up HISTORY FROM: patient  HISTORY OF PRESENT ILLNESS: Today 07/09/19:  Manuel Hartman is a 53 year old male with a history of seizures and attention deficit disorder.  He returns today for follow-up.  He reports that he is done well on Keppra.  Denies any seizure events.  Denies any changes in his mood or behavior.  He does report some issues with his memory.  Reports that he feels that he does not remember things like he should.  His wife is also noticed some changes.  He is able to complete all ADLs independently.  He operates a Librarian, academicmotor vehicle without difficulty.  He is unsure if the memory changes is just due to stress and anxiety?  He reports that he uses Adderall infrequently.  He states he only uses when he has a project for work.  He returns today for evaluation.  HISTORY 07/02/18:  Manuel Hartman is a 53 year old male with a history of seizures and attention deficit disorder.  He returns today for follow-up.  He reports that he has been doing well.  Denies any seizure events.  Continues on Keppra  XR 1500 mg daily.  He states that he uses Adderall primarily when he has a stressful project.  He reports that he just recently was reemployed within the last month.  He is wanting a refill on his Adderall.  He returns today for an evaluation.  REVIEW OF SYSTEMS: Out of a complete 14 system review of symptoms, the patient complains only of the following symptoms, and all other reviewed systems are negative.  See HPI  ALLERGIES: Allergies  Allergen Reactions  . Penicillins Other (See Comments)    chills    HOME MEDICATIONS: Outpatient Medications Prior to Visit  Medication Sig Dispense Refill  . amphetamine-dextroamphetamine (ADDERALL) 20 MG tablet Take 1 tablet (20 mg total) by mouth daily. 30 tablet 0  . atorvastatin (LIPITOR) 40 MG tablet Take 40 mg by mouth daily.    . Levetiracetam 750 MG TB24 TAKE 2 TABLETS (1,500 MG  TOTAL) BY MOUTH EVERY EVENING. 180 tablet 3  . propranolol (INDERAL) 20 MG tablet TAKE 1 TABLET (20 MG TOTAL) BY MOUTH AS NEEDED. 30 tablet 5  . sertraline (ZOLOFT) 25 MG tablet Take 25 mg by mouth daily.     No facility-administered medications prior to visit.     PAST MEDICAL HISTORY: Past Medical History:  Diagnosis Date  . HYPERLIPIDEMIA 06/28/2007  . SEIZURE DISORDER 06/28/2007    PAST SURGICAL HISTORY: Past Surgical History:  Procedure Laterality Date  . APPENDECTOMY  1994  . 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 Cancer Mother        gastric  . Heart disease Father [id number]       fatal mi  . Cancer Sister        breast  . Coronary artery disease Paternal Uncle [phone]    SOCIAL HISTORY: Social History   Socioeconomic History  . Marital status: Married    Spouse name: Not on file  . Number of children: 2  . Years of education: MA  . Highest education level: Not on file  Occupational History  . 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 Financial resource strain: Not on file  . Food insecurity    Worry: Not on file    Inability: Not on  file  . Transportation needs    Medical: Not on file    Non-medical: Not on file  Tobacco Use  . Smoking status: Never Smoker  . Smokeless tobacco: Never Used  Substance and Sexual Activity  . Alcohol use: Yes  . Drug use: No  . Sexual activity: Not on file  Lifestyle  . Physical activity    Days per week: Not on file    Minutes per session: Not on file  . Stress: Not on file  Relationships  . Social Musician on phone: Not on file    Gets together: Not on file    Attends religious service: Not on file    Active member of club or organization: Not on file    Attends meetings of clubs or organizations: Not on file    Relationship status: Not on file  . Intimate partner violence    Fear of current or ex partner: Not on file     Emotionally abused: Not on file    Physically abused: Not on file    Forced sexual activity: Not on file  Other Topics Concern  . Not on file  Social History Narrative   Lives at home w/ wife and 2 children.   Patient is right handed.   Patient drinks some caffeine daily      PHYSICAL EXAM  Vitals:   07/09/19 0903  BP: 116/88  Pulse: 74  Temp: (!) 96.9 F (36.1 C)  Weight: 239 lb (108.4 kg)  Height: 6' (1.829 m)   Body mass index is 32.41 kg/m.   Montreal Cognitive Assessment  07/09/2019  Visuospatial/ Executive (0/5) 5  Naming (0/3) 3  Attention: Read list of digits (0/2) 2  Attention: Read list of letters (0/1) 1  Attention: Serial 7 subtraction starting at 100 (0/3) 3  Language: Repeat phrase (0/2) 2  Language : Fluency (0/1) 1  Abstraction (0/2) 2  Delayed Recall (0/5) 1  Orientation (0/6) 6  Total 26  Adjusted Score (based on education) 26   20 minutes  Generalized: Well developed, in no acute distress   Neurological examination  Mentation: Alert oriented to time, place, history taking. Follows all commands speech and language fluent Cranial nerve II-XII: Pupils were equal round reactive to light. Extraocular movements were full, visual field were full on confrontational test.  Head turning and shoulder shrug  were normal and symmetric. Motor: The motor testing reveals 5 over 5 strength of all 4 extremities. Good symmetric motor tone is noted throughout.  Sensory: Sensory testing is intact to soft touch on all 4 extremities. No evidence of extinction is noted.  Coordination: Cerebellar testing reveals good finger-nose-finger and heel-to-shin bilaterally.  Gait and station: Gait is normal. Tandem gait is normal. Romberg is negative. No drift is seen.  Reflexes: Deep tendon reflexes are symmetric and normal bilaterally.   DIAGNOSTIC DATA (LABS, IMAGING, TESTING) - I reviewed patient records, labs, notes, testing and imaging myself where available.  Lab  Results  Component Value Date   WBC 4.6 08/28/2013   HGB 13.8 08/28/2013   HCT 40.2 08/28/2013   MCV 89.6 08/28/2013   PLT 193.0 08/28/2013      Component Value Date/Time   NA 139 08/28/2013 0912   K 4.6 08/28/2013 0912   CL 105 08/28/2013 0912   CO2 25 08/28/2013 0912   GLUCOSE 93 08/28/2013 0912   BUN 15 08/28/2013 0912   CREATININE 1.0 08/28/2013 0912   CALCIUM 9.2 08/28/2013  0912   PROT 7.4 08/28/2013 0912   ALBUMIN 4.1 08/28/2013 0912   AST 20 08/28/2013 0912   ALT 30 08/28/2013 0912   ALKPHOS 53 08/28/2013 0912   BILITOT 0.6 08/28/2013 0912   GFRNONAA 109.57 05/26/2010 0816   GFRAA 106 06/19/2007 1040   Lab Results  Component Value Date   CHOL 178 04/23/2015   HDL 44.80 04/23/2015   LDLCALC 107 (H) 04/23/2015   TRIG 130.0 04/23/2015   CHOLHDL 4 04/23/2015   No results found for: HGBA1C No results found for: VITAMINB12 Lab Results  Component Value Date   TSH 2.30 08/28/2013      ASSESSMENT AND PLAN 53 y.o. year old male  has a past medical history of HYPERLIPIDEMIA (06/28/2007) and SEIZURE DISORDER (06/28/2007). here with:  1.  Memory disturbance 2.  Seizures 3.  ADD  Overall the patient has done well in regards to his seizures.  He will continue on Keppra.  I will give him a refill of Adderall to use as needed for ADD.  The patient has noticed some changes with his memory. MOCA score was 26 out of 30.  I will check some blood work today.  I did advise that we can send to neuropsychological testing for further evaluation however the patient deferred for now.  He will follow-up in 1 year or sooner if needed.      Ward Givens, MSN, NP-C 07/09/2019, 9:17 AM [facility] [address] [place],  [address] [phone]

## 2019-07-09 NOTE — Progress Notes (Signed)
I have read the note, and I agree with the clinical assessment and plan.  Charles K Willis   

## 2019-07-09 NOTE — Patient Instructions (Signed)
Your Plan:  Continue Adams Blood work for memory If your symptoms worsen or you develop new symptoms please let us know.    Thank you for coming to see [place] at [facility]. I hope we have been able to provide you high quality care today.  You may receive a patient satisfaction survey over the next few weeks. We would appreciate your feedback and comments so that we may continue to improve ourselves and the health of our patients.

## 2019-07-11 LAB — COMPREHENSIVE METABOLIC PANEL
ALT: 40 IU/L (ref 0–44)
AST: 33 IU/L (ref 0–40)
Albumin/Globulin Ratio: 1.9 (ref 1.2–2.2)
Albumin: 4.8 g/dL (ref 3.8–4.9)
Alkaline Phosphatase: 56 IU/L (ref 39–117)
BUN/Creatinine Ratio: 18 (ref 9–20)
BUN: 17 mg/dL (ref 6–24)
Bilirubin Total: 0.4 mg/dL (ref 0.0–1.2)
CO2: 19 mmol/L — ABNORMAL LOW (ref 20–29)
Calcium: 9.4 mg/dL (ref 8.7–10.2)
Chloride: 99 mmol/L (ref 96–106)
Creatinine, Ser: 0.92 mg/dL (ref 0.76–1.27)
GFR calc Af Amer: 109 mL/min/{1.73_m2} (ref >59)
GFR calc non Af Amer: 94 mL/min/{1.73_m2} (ref >59)
Globulin, Total: 2.5 g/dL (ref 1.5–4.5)
Glucose: 96 mg/dL (ref 65–99)
Potassium: 4.3 mmol/L (ref 3.5–5.2)
Sodium: 137 mmol/L (ref 134–144)
Total Protein: 7.3 g/dL (ref 6.0–8.5)

## 2019-07-11 LAB — CBC WITH DIFFERENTIAL/PLATELET
Basophils Absolute: 0 10*3/uL (ref 0.0–0.2)
Basos: 1 %
EOS (ABSOLUTE): 0.1 10*3/uL (ref 0.0–0.4)
Eos: 1 %
Hematocrit: 40.4 % (ref 37.5–51.0)
Hemoglobin: 13.4 g/dL (ref 13.0–17.7)
Immature Grans (Abs): 0 10*3/uL (ref 0.0–0.1)
Immature Granulocytes: 0 %
Lymphocytes Absolute: 1.2 10*3/uL (ref 0.7–3.1)
Lymphs: 16 %
MCH: 31.2 pg (ref 26.6–33.0)
MCHC: 33.2 g/dL (ref 31.5–35.7)
MCV: 94 fL (ref 79–97)
Monocytes Absolute: 0.5 10*3/uL (ref 0.1–0.9)
Monocytes: 6 %
Neutrophils Absolute: 5.7 10*3/uL (ref 1.4–7.0)
Neutrophils: 76 %
Platelets: 226 10*3/uL (ref 150–450)
RBC: 4.29 x10E6/uL (ref 4.14–5.80)
RDW: 12.6 % (ref 11.6–15.4)
WBC: 7.5 10*3/uL (ref 3.4–10.8)

## 2019-07-11 LAB — METHYLMALONIC ACID, SERUM: Methylmalonic Acid: 270 nmol/L (ref 0–378)

## 2019-07-11 LAB — TSH: TSH: 3.03 u[IU]/mL (ref 0.450–4.500)

## 2019-07-11 LAB — VITAMIN B12: Vitamin B-12: 707 pg/mL (ref 232–1245)

## 2019-07-11 LAB — VITAMIN D 25 HYDROXY (VIT D DEFICIENCY, FRACTURES): Vit D, 25-Hydroxy: 27.7 ng/mL — ABNORMAL LOW (ref 30.0–100.0)

## 2019-07-15 ENCOUNTER — Telehealth: Payer: Self-pay | Admitting: *Deleted

## 2019-07-15 NOTE — Telephone Encounter (Signed)
-----   Message from Ward Givens, NP sent at 07/15/2019 10:34 AM EDT ----- VIT D is slightly low. Can supplement wit vit D3 600-800 units daily. All other blood work is normal.

## 2019-07-15 NOTE — Telephone Encounter (Signed)
Set message on mychart.

## 2019-09-09 ENCOUNTER — Telehealth: Payer: Self-pay | Admitting: Adult Health

## 2019-09-09 DIAGNOSIS — R0683 Snoring: Secondary | ICD-10-CM

## 2019-09-09 DIAGNOSIS — R413 Other amnesia: Secondary | ICD-10-CM

## 2019-09-09 DIAGNOSIS — R400 Somnolence: Secondary | ICD-10-CM

## 2019-09-09 NOTE — Addendum Note (Signed)
Addended by: Trudie Buckler on: 09/09/2019 02:13 PM   Modules accepted: Orders

## 2019-09-09 NOTE — Telephone Encounter (Signed)
The pt called stating, " he and Ward Givens, NP talked about him completing a sleep study" Pt stated, "he was told to call back when he was ready to schedule". Pt would like to schedule sleep consult. I did not see any mention of this in his last office visit notes. Megan if you would like pt to proceed with this will you please put in a referral on the pt.

## 2019-09-09 NOTE — Telephone Encounter (Signed)
I called the patient.  We have briefly discussed a sleep study during the office visit.  He states that he is now snoring loudly according to his wife.  He states that he is sleepy during the day.  Reports that he is having to drink a lot of caffeine in order to stay alert.  He denies been waking up with a headache.  He continues to notice some changes with his memory.  Reports that initially he may not remember something but typically he will recall it later.  He would like a sleep evaluation if possible.

## 2019-09-17 ENCOUNTER — Other Ambulatory Visit: Payer: Self-pay

## 2019-09-17 ENCOUNTER — Encounter: Payer: Self-pay | Admitting: Neurology

## 2019-09-17 ENCOUNTER — Ambulatory Visit (INDEPENDENT_AMBULATORY_CARE_PROVIDER_SITE_OTHER): Payer: 59 | Admitting: Neurology

## 2019-09-17 VITALS — BP 129/83 | HR 64 | Temp 96.9°F | Ht 72.0 in | Wt 246.0 lb

## 2019-09-17 DIAGNOSIS — Z820 Family history of epilepsy and other diseases of the nervous system: Secondary | ICD-10-CM | POA: Diagnosis not present

## 2019-09-17 DIAGNOSIS — F909 Attention-deficit hyperactivity disorder, unspecified type: Secondary | ICD-10-CM

## 2019-09-17 DIAGNOSIS — R569 Unspecified convulsions: Secondary | ICD-10-CM

## 2019-09-17 DIAGNOSIS — R413 Other amnesia: Secondary | ICD-10-CM | POA: Diagnosis not present

## 2019-09-17 DIAGNOSIS — R0683 Snoring: Secondary | ICD-10-CM

## 2019-09-17 NOTE — Patient Instructions (Signed)

## 2019-09-17 NOTE — Progress Notes (Signed)
SLEEP MEDICINE CLINIC    Provider:  Melvyn Novas, MD  Primary Care Physician:  Rodrigo Ran, MD [address] [place] [address]     Referring Provider: Rodrigo Ran, Md [address] [place],  [place] [address]          Chief Complaint according to patient   Patient presents with:    . New Patient (Initial Visit)     saw NP and was discussing he was having memory concerns. tested memory which appeated to look good. pt does snore in sleep and has 2 brothers that are on CPAP so wanting to rule out CPAP.       HISTORY OF PRESENT ILLNESS:  Manuel Hartman is a 52 year old Caucasian male patient and seen  In a face to face consultation on 09/17/2019 initiated by Butch Penny, NP. He is established as a seizure patient with Dr Anne Hahn, has epilepsy since age 40.   Chief concern according to patient : " I may have apnea. "   I have the pleasure of seeing Manuel Hartman today, a right-handed Caucasian male with a possible sleep disorder.  he   has a past medical history of HYPERLIPIDEMIA (06/28/2007) and SEIZURE DISORDER (06/28/2007).   Sleep relevant medical history:  Night terrors as a child, Tonsillectomy in childhood, had some concussion. Family medical /sleep history: 2 brothers  on CPAP with OSA, no insomnia, no sleep walkers.  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  The patient currently works in daytime form office. Pets are present- 3 dogs. Tobacco use; 1 year in college. ETOH use: on weekends 2-4 glasses., Caffeine intake in form of Coffee( 3 cups in AM ) Soda( one a day ) Tea ( decaffeinated ) and Monster or [employer]  energy drinks. Regular exercise: none.   Hobbies : Tennis     Sleep habits are as follows: The patient's dinner time is between 7.30 PM. The patient goes to bed at 11.30 PM and watches TV until midnight- continues to  sleep for several  hours, wakes rarely  for bathroom breaks. The preferred sleep position is laterrally and prone, with the support of 2 pillows.  Dreams are reportedly rare.  6.30 AM is the usual rise time.  The patient wakes up with an alarm or by his dogs.Marland Kitchen  He reports often not feeling refreshed or restored in AM, with a desire to sleep another hour-  symptoms such as dry mouth , throat is sore, morning headaches and residual fatigue.  Naps are taken rarely-    Review of Systems: Out of a complete 14 system review, the patient complains of only the following symptoms, and all other reviewed systems are negative.:  Fatigue, snoring witnessed by spouse and he has woken himself, fragmented sleep, sleep hygiene, caffeine use, late TV.  ADD on adderall, seizures on Keppra.    How likely are you to doze in the following situations: 0 = not likely, 1 = slight chance, 2 = moderate chance, 3 = high chance   Sitting and Reading? Watching Television? Sitting inactive in a public place (theater or meeting)? As a passenger in a car for an hour without a break? Lying down in the afternoon when circumstances permit? Sitting and talking to someone? Sitting quietly after lunch without alcohol? In a car, while stopped for a  few minutes in traffic?   Total = 1/ 24 points   FSS endorsed at 19/ 63 points.   Social History   Socioeconomic History  . Marital status: Married    Spouse name: Not on file  . Number of children: 2  . Years of education: MA  . Highest education level: Not on file  Occupational History  . Occupation: Data processing manager  . Financial resource strain: Not on file  . Food insecurity    Worry: Not on file    Inability: Not on file  . Transportation needs    Medical: Not on file    Non-medical: Not on file  Tobacco Use  . Smoking status: Never Smoker  . Smokeless tobacco: Never Used  Substance and Sexual Activity  . Alcohol use: Yes  . Drug use: No  .  Sexual activity: Not on file  Lifestyle  . Physical activity    Days per week: Not on file    Minutes per session: Not on file  . Stress: Not on file  Relationships  . Social Herbalist on phone: Not on file    Gets together: Not on file    Attends religious service: Not on file    Active member of club or organization: Not on file    Attends meetings of clubs or organizations: Not on file    Relationship status: Not on file  Other Topics Concern  . Not on file  Social History Narrative   Lives at home w/ wife and 2 children.   Patient is right handed.   Patient drinks some caffeine daily    Family History  Problem Relation Age of Onset  . Cancer Mother        gastric  . Heart disease Father 95       fatal mi  . Cancer Sister        breast  . Coronary artery disease Paternal Uncle 50    Past Medical History:  Diagnosis Date  . HYPERLIPIDEMIA 06/28/2007  . SEIZURE DISORDER 06/28/2007    Past Surgical History:  Procedure Laterality Date  . APPENDECTOMY  1994  . 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 amphetamine-dextroamphetamine (ADDERALL) 20 MG tablet Take 1 tablet (20 mg total) by mouth daily as needed. 30 tablet 0  . atorvastatin (LIPITOR) 40 MG tablet Take 40 mg by mouth daily.    . Levetiracetam 750 MG TB24 TAKE 2 TABLETS (1,500 MG TOTAL) BY MOUTH EVERY EVENING. 180 tablet 3  . propranolol (INDERAL) 20 MG tablet TAKE 1 TABLET (20 MG TOTAL) BY MOUTH AS NEEDED. 30 tablet 5  . sertraline (ZOLOFT) 25 MG tablet Take 25 mg by mouth daily.     No current facility-administered medications on file prior to visit.     Allergies  Allergen Reactions  . Penicillins Other (See Comments)    chills    Physical exam:  Today's Vitals   09/17/19 0956  BP: 129/83  Pulse: 64  Temp: (!) 96.9 F (36.1 C)  Weight: 246 lb (111.6 kg)  Height: 6' (1.829 m)   Body  mass index is 33.36 kg/m.   Wt Readings from Last 3 Encounters:  09/17/19 246 lb (111.6 kg)  07/09/19 239 lb (108.4 kg)  07/02/18 240 lb (108.9 kg)     Ht Readings from Last 3  Encounters:  09/17/19 6' (1.829 m)  07/09/19 6' (1.829 m)  07/02/18 6' (1.829 m)      General: The patient is awake, alert and appears not in acute distress. The patient is well groomed. Head: Normocephalic, atraumatic. Neck is supple. Mallampati 4,  neck circumference:17 inches . Nasal airflow  patent.   Retrognathia is not  seen.  Dental status:  Cardiovascular:  Regular rate and cardiac rhythm by pulse,  without distended neck veins. Respiratory: Lungs are clear to auscultation.  Skin:  Without evidence of ankle edema, or rash. Trunk: The patient's posture is erect.   Neurologic exam : The patient is awake and alert, oriented to place and time.   Memory subjective described as intact.  Attention span & concentration ability appears normal.  Speech is fluent,  without  dysarthria, dysphonia or aphasia.  Mood and affect are appropriate.   Cranial nerves: no loss of smell or taste reported !  Pupils are equal and briskly reactive to light. Funduscopic exam deferred.. wearing bifocals.  Extraocular movements in vertical and horizontal planes were intact and without nystagmus. No Diplopia. Visual fields by finger perimetry are intact. Hearing was intact to soft voice and finger rubbing. Facial sensation intact to fine touch. Facial motor strength is symmetric and tongue and uvula move midline.  Neck ROM : rotation, tilt and flexion extension were normal for age and shoulder shrug was symmetrical.    Motor exam:  Symmetric bulk, tone and ROM.   Normal tone without cog wheeling, symmetric grip strength . Sensory:  Fine touch, pinprick and vibration were normal.  Proprioception tested in the upper extremities was normal. Coordination: Rapid alternating movements in the fingers/hands were of normal speed.   The Finger-to-nose maneuver was intact without evidence of ataxia, dysmetria or tremor. Gait and station: Patient could rise unassisted from a seated position, walked without assistive device.  Stance is of normal width/ base and the patient turned with 3 steps.  Toe and heel walk were deferred.  Deep tendon reflexes: in the  upper and lower extremities are symmetrically brisk and intact.  Babinski response was deferred.       After spending a total time of 30  minutes face to face and additional time for physical and neurologic examination, review of laboratory studies,  personal review of imaging studies, reports and results of other testing and review of referral information / records as far as provided in visit, I have established the following assessments:  1) wife reported snoring loudly, he is not sleepy in daytime but only sleeps 6 hours at night. He reports subjective memory changes. Memory test did not show a deficit.    2) ADD - adderall may interference with sleep latenecy  3) Seizure disorder since age 71- had 6 seizures he recalls, and is now on Keppra, well  controlled.    My Plan is to proceed with:  1)  HST to rule out apnea  2)  adressed sleep hygiene, TV in bedroom, cut caffeine to before 2 PM.  3)  Reduce or eliminate Energy drinks, avoiding supine sleep.  4) weight loss by exercise and high protein and low carbohydrate diet.   I would like to thank Rodrigo Ran, MD  [address] [place],  [place] [address]  and Butch Penny, NP  for allowing me to meet with and to take care of this pleasant patient.   In short, Manuel Hartman is presenting with  I plan to follow up either personally  or through our NP within 2-3 month.   CC: I will share my notes with PCP .  Electronically signed by: Melvyn Novasarmen Christopherjohn Schiele, MD 09/17/2019 10:08 AM  [facility] certified by The [employer] Board of Sleep Medicine and Diplomate of the  [employer] of [employer] Medicine. Board certified In Neurology through the ABPN, Fellow of the [employer] of Neurology. Medical Director of [facility].

## 2019-10-15 ENCOUNTER — Telehealth: Payer: Self-pay

## 2019-10-15 NOTE — Telephone Encounter (Signed)
We have attempted to call the patient two times to schedule sleep study.  Patient has been unavailable at the phone numbers we have on file and has not returned our calls. If patient calls back we will schedule them for their sleep study.  

## 2019-10-21 ENCOUNTER — Ambulatory Visit: Payer: Managed Care, Other (non HMO) | Attending: Internal Medicine

## 2019-10-21 DIAGNOSIS — Z20822 Contact with and (suspected) exposure to covid-19: Secondary | ICD-10-CM

## 2019-10-23 LAB — NOVEL CORONAVIRUS, NAA: SARS-CoV-2, NAA: NOT DETECTED

## 2019-11-05 ENCOUNTER — Other Ambulatory Visit: Payer: Self-pay

## 2019-11-05 DIAGNOSIS — Z20822 Contact with and (suspected) exposure to covid-19: Secondary | ICD-10-CM

## 2019-11-07 LAB — NOVEL CORONAVIRUS, NAA: SARS-CoV-2, NAA: NOT DETECTED

## 2019-12-01 ENCOUNTER — Other Ambulatory Visit: Payer: Self-pay

## 2019-12-01 ENCOUNTER — Ambulatory Visit (INDEPENDENT_AMBULATORY_CARE_PROVIDER_SITE_OTHER): Payer: 59 | Admitting: Neurology

## 2019-12-01 DIAGNOSIS — F909 Attention-deficit hyperactivity disorder, unspecified type: Secondary | ICD-10-CM

## 2019-12-01 DIAGNOSIS — R0683 Snoring: Secondary | ICD-10-CM

## 2019-12-01 DIAGNOSIS — Z820 Family history of epilepsy and other diseases of the nervous system: Secondary | ICD-10-CM

## 2019-12-01 DIAGNOSIS — R413 Other amnesia: Secondary | ICD-10-CM

## 2019-12-01 DIAGNOSIS — G4733 Obstructive sleep apnea (adult) (pediatric): Secondary | ICD-10-CM

## 2019-12-01 DIAGNOSIS — R569 Unspecified convulsions: Secondary | ICD-10-CM

## 2019-12-16 ENCOUNTER — Encounter: Payer: Self-pay | Admitting: Gastroenterology

## 2019-12-17 DIAGNOSIS — R569 Unspecified convulsions: Secondary | ICD-10-CM | POA: Insufficient documentation

## 2019-12-17 DIAGNOSIS — R0683 Snoring: Secondary | ICD-10-CM | POA: Insufficient documentation

## 2019-12-17 DIAGNOSIS — Z820 Family history of epilepsy and other diseases of the nervous system: Secondary | ICD-10-CM | POA: Insufficient documentation

## 2019-12-17 DIAGNOSIS — R413 Other amnesia: Secondary | ICD-10-CM | POA: Insufficient documentation

## 2019-12-17 NOTE — Procedures (Signed)
Patient Information     First Name: Manuel Last Name: Hartman ID: [id number]  Birth Date: 1965-12-28 Age: 54 Gender: Male  Referring Provider: Referred by Rodrigo Ran, MD BMI: 32.7 (W=247 lb, H=6' 1'')  Neck Circ.:  17 '' Epworth:  1/24   Sleep Study Information    Study Date: Dec 01, 2019 Data loaded 12-08-2019 S/H/A Version: [id number] / 4.1.1528 / 77  History:    Manuel Hartman is a 54 year -old Caucasian male patient and was seen in a face -to -face consultation on 09/17/2019, initiated by Butch Penny, NP, for this seizure patient of Dr Anne Hahn', who has epilepsy since age 51.  On Adderall and Keppra. He reports neither fatigue nor EDS (excessive daytime sleepiness) but wakes up with a dry mouth and often with headaches- He uses Adderall in daytime for ADD. Wife reports snoring.         Summary & Diagnosis:      The HST recorded a moderate-Severe degree of sleep apnea, and loud snoring. This is more complex sleep apnea, accentuated in NREM sleep, not in REM sleep. There is intermittent bradycardia noted without prolonged hypoxia.  Recommendations:       This non- REM dependent apnea appears to be worsened by sleeping on the right side- avoiding this position may already reduce apnea somewhat. However, the overall degree of apnea requires intervention.  Either CPAP or dental device can be used for non -REM dependent apnea without hypoxia.  I will order a CPAP autotitration device for this patient -, pressure settings will be from 6-16 cm water with 2 cm EPR and heated humidity, mask of choice.   Interpreting Physician: Melvyn Novas, MD            Sleep Summary  Oxygen Saturation Statistics   Start Study Time: End Study Time: Total Recording Time:       11:07:49 PM 6:42:37 AM         7 h, 34 min  Total Sleep Time % REM of Sleep Time:  6 h, 1 min  26.7    Mean: 95 Minimum: 80 Maximum: 99  Mean of Desaturations Nadirs (%):   92  Oxygen Desaturation. %:    4-9 10-20 >20 Total  Events Number Total    86  10 89.6 10.4  0 0.0  96 100.0  Oxygen Saturation: <90 <=88 <85 <80 <70  Duration (minutes): Sleep % 3.1 0.9  2.4 0.9  0.6 0.3 0.0 0.0 0.0 0.0     Respiratory Indices      Total Events REM NREM All Night  pRDI:  221  pAHI:  194 ODI:  96  pAHIc:  19  % CSR: 0.0 34.1 25.1 13.5 2.6 41.2 38.0 18.4 3.7 39.2 34.4 17.0 3.4       Pulse Rate Statistics during Sleep (BPM)      Mean:  54 Minimum: 44 Maximum: 78    Indices are calculated using technically valid sleep time of  5 h, 38 min. pRDI/pAHI are calculated using 02 desaturations ? 3%   Body Position Statistics  Position Supine Prone Right Left Non-Supine  Sleep (min) 40.5 115.8 66.0 139.5 321.3  Sleep % 11.2 32.0 18.2 38.6 88.8  pRDI 47.1 40.4 65.8 24.0 38.2  pAHI 44.0 30.6 65.8 20.0 33.2  ODI 33.4 8.6 34.4 10.5 14.9     Snoring Statistics Snoring Level (dB) >40 >50 >60 >70 >80 >Threshold (45)  Sleep (min) 180.9 84.4 22.4 0.7 0.0  117.6  Sleep % 50.0 23.3 6.2 0.2 0.0 32.5    Mean: 45 dB

## 2019-12-17 NOTE — Addendum Note (Signed)
Addended by: Melvyn Novas on: 12/17/2019 05:30 PM   Modules accepted: Orders

## 2019-12-17 NOTE — Progress Notes (Signed)
This HST recorded a moderate-Severe degree of sleep apnea at AHI of 34/h, and loud snoring. This is more complex sleep apnea, accentuated in NREM sleep, not in REM sleep. There is intermittent bradycardia noted without prolonged hypoxia.  Recommendations:     This non- REM dependent apnea appears to be worsened by sleeping on the right side- avoiding this position may already reduce apnea somewhat. However, the overall degree of apnea requires intervention.  Either CPAP or dental device can be used for non -REM dependent apnea without hypoxia.  I will order a CPAP autotitration device for this patient -, pressure settings will be from 6-16 cm water with 2 cm EPR and heated humidity, mask of choice.   Interpreting Physician: Kaitlyn Skowron, MD  

## 2019-12-17 NOTE — Progress Notes (Signed)
This HST recorded a moderate-Severe degree of sleep apnea at AHI of 34/h, and loud snoring. This is more complex sleep apnea, accentuated in NREM sleep, not in REM sleep. There is intermittent bradycardia noted without prolonged hypoxia.  Recommendations:     This non- REM dependent apnea appears to be worsened by sleeping on the right side- avoiding this position may already reduce apnea somewhat. However, the overall degree of apnea requires intervention.  Either CPAP or dental device can be used for non -REM dependent apnea without hypoxia.  I will order a CPAP autotitration device for this patient -, pressure settings will be from 6-16 cm water with 2 cm EPR and heated humidity, mask of choice.   Interpreting Physician: Melvyn Novas, MD

## 2019-12-18 ENCOUNTER — Encounter: Payer: Self-pay | Admitting: Neurology

## 2019-12-18 ENCOUNTER — Telehealth: Payer: Self-pay | Admitting: Neurology

## 2019-12-18 NOTE — Telephone Encounter (Signed)
-----   Message from Melvyn Novas, MD sent at 12/17/2019  5:30 PM EST ----- This HST recorded a moderate-Severe degree of sleep apnea at AHI of 34/h, and loud snoring. This is more complex sleep apnea, accentuated in NREM sleep, not in REM sleep. There is intermittent bradycardia noted without prolonged hypoxia.  Recommendations:      This non- REM dependent apnea appears to be worsened by sleeping on the right side- avoiding this position may already reduce apnea somewhat. However, the overall degree of apnea requires intervention.  Either CPAP or dental device can be used for non -REM dependent apnea without hypoxia.  I will order a CPAP autotitration device for this patient -, pressure settings will be from 6-16 cm water with 2 cm EPR and heated humidity, mask of choice.   Interpreting Physician: Melvyn Novas, MD

## 2019-12-18 NOTE — Telephone Encounter (Signed)
Called the patient to review sleep study results. There was no answer. LVM advising was calling to review results. Advised our office is closing today and possibly tomorrow due to inclement weather. Advised he can return call on Monday to our office and will review with him at that time, in meantime I will also send a mychart message to the pt.

## 2019-12-23 NOTE — Telephone Encounter (Signed)
I called pt. I advised pt that Dr. Vickey Huger reviewed their sleep study results and found that pt has moderate to severe sleep apnea. Dr. Vickey Huger recommends that pt starts auto CPAP. I reviewed PAP compliance expectations with the pt. Pt is agreeable to starting a CPAP. I advised pt that an order will be sent to a DME, [facility], and [facility] will call the pt within about one week after they file with the pt's insurance. [facility] will show the pt how to use the machine, fit for masks, and troubleshoot the CPAP if needed. A follow up appt was made for insurance purposes with Butch Penny, NP on 02/16/2020 at 8:30 am. Pt verbalized understanding to arrive 15 minutes early and bring their CPAP. A letter with all of this information in it will be mailed to the pt as a reminder. I verified with the pt that the address we have on file is correct. Pt verbalized understanding of results. Pt had no questions at this time but was encouraged to call back if questions arise. I have sent the order to [facility] and have received confirmation that they have received the order.

## 2019-12-23 NOTE — Telephone Encounter (Signed)
Pt has called to speak with Casey,RN re: sleep study.  Please call

## 2020-01-08 ENCOUNTER — Ambulatory Visit (AMBULATORY_SURGERY_CENTER): Payer: Self-pay

## 2020-01-08 ENCOUNTER — Other Ambulatory Visit: Payer: Self-pay

## 2020-01-08 VITALS — Temp 96.8°F | Ht 72.0 in | Wt 252.0 lb

## 2020-01-08 DIAGNOSIS — Z1211 Encounter for screening for malignant neoplasm of colon: Secondary | ICD-10-CM

## 2020-01-08 DIAGNOSIS — Z01818 Encounter for other preprocedural examination: Secondary | ICD-10-CM

## 2020-01-08 MED ORDER — NA SULFATE-K SULFATE-MG SULF 17.5-3.13-1.6 GM/177ML PO SOLN: 1.0000 | Freq: Once | ORAL | 0 refills | Status: AC

## 2020-01-08 NOTE — Progress Notes (Signed)
No allergies to soy or egg Pt is not on blood thinners or diet pills Denies issues with sedation/intubation Denies atrial flutter/fib Denies constipation   Emmi instructions given to pt  Pt is aware of Covid safety and care partner requirements.  

## 2020-01-22 ENCOUNTER — Encounter: Payer: Managed Care, Other (non HMO) | Admitting: Gastroenterology

## 2020-02-10 ENCOUNTER — Encounter: Payer: 59 | Admitting: Gastroenterology

## 2020-02-12 ENCOUNTER — Other Ambulatory Visit: Payer: Self-pay

## 2020-02-12 ENCOUNTER — Encounter: Payer: Self-pay | Admitting: Surgical

## 2020-02-12 ENCOUNTER — Ambulatory Visit: Payer: 59 | Admitting: Surgical

## 2020-02-12 DIAGNOSIS — R29898 Other symptoms and signs involving the musculoskeletal system: Secondary | ICD-10-CM | POA: Diagnosis not present

## 2020-02-12 DIAGNOSIS — M25512 Pain in left shoulder: Secondary | ICD-10-CM | POA: Diagnosis not present

## 2020-02-12 MED ORDER — MELOXICAM 15 MG PO TABS: 15.0000 mg | ORAL_TABLET | Freq: Every day | ORAL | 0 refills | Status: DC

## 2020-02-12 NOTE — Progress Notes (Signed)
Office Visit Note   Patient: Manuel Hartman           Date of Birth: [birth year]           MRN: [id number] Visit Date: 02/12/2020 Requested by: Crist Infante, MD [address] [place],  [place] [address] PCP: Crist Infante, MD  Subjective: Chief Complaint  Patient presents with  . Left Shoulder - Pain    HPI: Manuel Hartman is a 54 y.o. male who presents to the office complaining of left shoulder pain.  Patient notes that in December 2020 he fell while skiing with his shoulders forward flexed.  He notes that he has had persistent pain since the injury.  He is not having any pain prior to the injury.  He has never injured his shoulder before.  He has never had surgery on his shoulder before.  He has been seen by [facility] he took x-rays that showed no fracture or any concerning pathology.  He had an injection that provided a day of relief but quickly returned.  He was diagnosed with shoulder impingement.  He does note some subjective weakness but pain does not wake him up at night.  Denies any neck pain or numbness/tingling.  Is been taking Advil without any significant relief..                ROS:  All systems reviewed are negative as they relate to the chief complaint within the history of present illness.  Patient denies fevers or chills.  Assessment & Plan: Visit Diagnoses:  1. Left shoulder pain, unspecified chronicity   2. Shoulder weakness     Plan: Patient is a 54 year old male who presents following a skiing injury 4 to 5 months ago.  He has been having persistent pain that has not been improved by a subacromial injection for any significant amount of time.  He has had x-rays at [facility] that were unremarkable.  Today on exam, he does have some impingement signs with a positive Neer impingement test.  More concerning though is his unilateral shoulder coarseness with passive range of motion that is felt in the anterior superior left shoulder.  Additionally he has significant  weakness of the infraspinatus and supraspinatus on exam.  Concern for rotator cuff tear.  Ordered MRI arthrogram of the left shoulder for further evaluation.  Also will prescribe Mobic to help with pain.  He will follow-up after MRI to review results.  Patient agreed with plan.  Follow-Up Instructions: No follow-ups on file.   Orders:  Orders Placed This Encounter  Procedures  . MR Shoulder Left w/ contrast  . Arthrogram   No orders of the defined types were placed in this encounter.     Procedures: No procedures performed   Clinical Data: No additional findings.  Objective: Vital Signs: There were no vitals taken for this visit.  Physical Exam:  Constitutional: Patient appears well-developed HEENT:  Head: Normocephalic Eyes:EOM are normal Neck: Normal range of motion Cardiovascular: Normal rate Pulmonary/chest: Effort normal Neurologic: Patient is alert Skin: Skin is warm Psychiatric: Patient has normal mood and affect  Ortho Exam:  Left shoulder Exam Mild TTP over the bicipital groove.  Positive O'Brien's test.  Moderate weakness of the supraspinatus.  Marked weakness of the infraspinatus. Able to fully forward flex and abduct shoulder overhead No loss of ER relative to the other shoulder.  Good endpoint with ER No TTP over the [facility] joint. 5/5 motor strength with subscapularis resistance testing Positive Neer's  impingement 5/5 grip strength, forearm pronation/supination, and bicep strength  Specialty Comments:  No specialty comments available.  Imaging: No results found.   PMFS History: Patient Active Problem List   Diagnosis Date Noted  . Family history of sleep apnea 12/17/2019  . Snoring 12/17/2019  . Seizures (HCC) 12/17/2019  . Complaints of memory disturbance 12/17/2019  . Chest pain 02/04/2016  . ADHD (attention deficit hyperactivity disorder) 12/01/2011  . Hyperlipidemia 06/28/2007  . Convulsions (HCC) 06/28/2007   Past Medical History:   Diagnosis Date  . Anxiety   . Arthritis   . HYPERLIPIDEMIA 06/28/2007  . SEIZURE DISORDER 06/28/2007  . Shoulder pain   . Sleep apnea    pending CPAP    Family History  Problem Relation Age of Onset  . Cancer Mother        gastric  . Stomach cancer Mother 93  . Heart disease Father 43       fatal mi  . Cancer Sister        breast  . Coronary artery disease Paternal Uncle 55  . Colon cancer Neg Hx   . Colon polyps Neg Hx   . Esophageal cancer Neg Hx   . Rectal cancer Neg Hx     Past Surgical History:  Procedure Laterality Date  . APPENDECTOMY  1994  . SURGERY SCROTAL / TESTICULAR    . 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 Occupation: Pharmaceuticals  Tobacco Use  . Smoking status: Light Tobacco Smoker    Types: Cigars  . Smokeless tobacco: Never Used  Substance and Sexual Activity  . Alcohol use: Yes  . Drug use: No  . Sexual activity: Not on file

## 2020-02-16 ENCOUNTER — Encounter: Payer: Self-pay | Admitting: Adult Health

## 2020-02-16 ENCOUNTER — Ambulatory Visit: Payer: Self-pay | Admitting: Adult Health

## 2020-03-01 ENCOUNTER — Telehealth: Payer: Self-pay | Admitting: Orthopedic Surgery

## 2020-03-01 NOTE — Telephone Encounter (Signed)
LM on voicemail for patient to call the office to schedule an MRI review appointment with Dr. August Saucer after May 11th.

## 2020-03-09 ENCOUNTER — Other Ambulatory Visit: Payer: Self-pay

## 2020-03-09 ENCOUNTER — Ambulatory Visit
Admission: RE | Admit: 2020-03-09 | Discharge: 2020-03-09 | Disposition: A | Discharge status: Home / Self Care | Payer: 59 | Source: Ambulatory Visit | Attending: Surgical | Admitting: Surgical

## 2020-03-09 DIAGNOSIS — M25512 Pain in left shoulder: Secondary | ICD-10-CM

## 2020-03-09 MED ORDER — IOPAMIDOL (ISOVUE-M 200) INJECTION 41%
20.0000 mL | Freq: Once | INTRAMUSCULAR | Status: AC
  Administered 2020-03-09: 20 mL via INTRA_ARTICULAR

## 2020-03-10 ENCOUNTER — Ambulatory Visit: Payer: 59 | Admitting: Orthopedic Surgery

## 2020-03-10 DIAGNOSIS — M75102 Unspecified rotator cuff tear or rupture of left shoulder, not specified as traumatic: Secondary | ICD-10-CM

## 2020-03-11 ENCOUNTER — Other Ambulatory Visit: Payer: Self-pay | Admitting: Surgical

## 2020-03-12 ENCOUNTER — Encounter: Payer: Self-pay | Admitting: Orthopedic Surgery

## 2020-03-12 NOTE — Progress Notes (Signed)
Office Visit Note   Patient: Manuel Hartman           Date of Birth: [birth year]           MRN: [id number] Visit Date: 03/10/2020 Requested by: Crist Infante, MD [address] [place],  [place] [address] PCP: Crist Infante, MD  Subjective: Chief Complaint  Patient presents with  . Left Shoulder - Follow-up    HPI: Manuel Hartman is a 54 y.o. male who presents to the office complaining of patient is a 54 year old male presents complaining of left shoulder pain.  He returns for discussion of MRI results.  MRI of the left shoulder revealed moderate tendinosis of the supraspinatus tendon with a moderate insertional interstitial tear as well as moderate subacromial/subdeltoid bursitis and a small tear of the superior labrum.  He has had no change in symptoms since his last office visit.  All began with a skiing accident months ago...                ROS:  All systems reviewed are negative as they relate to the chief complaint within the history of present illness.  Patient denies fevers or chills.  Assessment & Plan: Visit Diagnoses:  1. Tear of left supraspinatus tendon     Plan: Patient is a 54 year old male presents complaining of left shoulder pain.  He presents for left shoulder MRI review.  MRI results are as described in HPI.  After discussion of results, patient opts to try a subacromial injection today as well as starting physical therapy 2 times a week for the next 6 weeks.  Plan for patient to follow-up in 6 weeks for clinical recheck.  In general this is a type of tear which could heal or become less symptomatic on its own.  No indication at this time for surgical intervention.  He does need to be careful with heavy lifting..  Patient agrees with plan.  He tolerated the subacromial left shoulder injection well.  Follow-up in 6 weeks to assess the efficacy of the injection and to evaluate whether any intervention is indicated.  Follow-Up Instructions: No follow-ups on file.    Orders:  No orders of the defined types were placed in this encounter.  No orders of the defined types were placed in this encounter.     Procedures: Large Joint Inj: L subacromial bursa on 03/13/2020 6:27 PM Indications: diagnostic evaluation and pain Details: 18 G 1.5 in needle, posterior approach  Arthrogram: No  Medications: 9 mL bupivacaine 0.5 %; 40 mg methylPREDNISolone acetate 40 MG/ML; 5 mL lidocaine 1 % Outcome: tolerated well, no immediate complications Procedure, treatment alternatives, risks and benefits explained, specific risks discussed. Consent was given by the patient. Immediately prior to procedure a time out was called to verify the correct patient, procedure, equipment, support staff and site/side marked as required. Patient was prepped and draped in the usual sterile fashion.       Clinical Data: No additional findings.  Objective: Vital Signs: There were no vitals taken for this visit.  Physical Exam:  Constitutional: Patient appears well-developed HEENT:  Head: Normocephalic Eyes:EOM are normal Neck: Normal range of motion Cardiovascular: Normal rate Pulmonary/chest: Effort normal Neurologic: Patient is alert Skin: Skin is warm Psychiatric: Patient has normal mood and affect  Ortho Exam:  Left shoulder Exam Able to fully forward flex and abduct shoulder overhead No loss of ER relative to the other shoulder.  Good endpoint with ER Tender to palpation over the bicipital groove.  No significant tenderness palpation over the [facility] joint Good subscapularis and infraspinatus strength.  Moderate weakness of the supraspinatus.  Pain elicited with supraspinatus resistance testing. Positive Hawkins impingement Positive O'Brien's test 5/5 grip strength, forearm pronation/supination, and bicep strength  Specialty Comments:  No specialty comments available.  Imaging: No results found.   PMFS History: Patient Active Problem List   Diagnosis Date  Noted  . Family history of sleep apnea 12/17/2019  . Snoring 12/17/2019  . Seizures (HCC) 12/17/2019  . Complaints of memory disturbance 12/17/2019  . Chest pain 02/04/2016  . ADHD (attention deficit hyperactivity disorder) 12/01/2011  . Hyperlipidemia 06/28/2007  . Convulsions (HCC) 06/28/2007   Past Medical History:  Diagnosis Date  . Anxiety   . Arthritis   . HYPERLIPIDEMIA 06/28/2007  . SEIZURE DISORDER 06/28/2007  . Shoulder pain   . Sleep apnea    pending CPAP    Family History  Problem Relation Age of Onset  . Cancer Mother        gastric  . Stomach cancer Mother 43  . Heart disease Father 17       fatal mi  . Cancer Sister        breast  . Coronary artery disease Paternal Uncle 55  . Colon cancer Neg Hx   . Colon polyps Neg Hx   . Esophageal cancer Neg Hx   . Rectal cancer Neg Hx     Past Surgical History:  Procedure Laterality Date  . APPENDECTOMY  1994  . SURGERY SCROTAL / TESTICULAR    . 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 Occupation: Pharmaceuticals  Tobacco Use  . Smoking status: Light Tobacco Smoker    Types: Cigars  . Smokeless tobacco: Never Used  Substance and Sexual Activity  . Alcohol use: Yes  . Drug use: No  . Sexual activity: Not on file

## 2020-03-13 ENCOUNTER — Encounter: Payer: Self-pay | Admitting: Orthopedic Surgery

## 2020-03-13 DIAGNOSIS — M75102 Unspecified rotator cuff tear or rupture of left shoulder, not specified as traumatic: Secondary | ICD-10-CM

## 2020-03-13 MED ORDER — BUPIVACAINE HCL 0.5 % IJ SOLN
9.0000 mL | INTRAMUSCULAR | Status: AC | PRN
  Administered 2020-03-13: 9 mL via INTRA_ARTICULAR

## 2020-03-13 MED ORDER — METHYLPREDNISOLONE ACETATE 40 MG/ML IJ SUSP
40.0000 mg | INTRAMUSCULAR | Status: AC | PRN
  Administered 2020-03-13: 40 mg via INTRA_ARTICULAR

## 2020-03-13 MED ORDER — LIDOCAINE HCL 1 % IJ SOLN
5.0000 mL | INTRAMUSCULAR | Status: AC | PRN
  Administered 2020-03-13: 5 mL

## 2020-06-01 ENCOUNTER — Encounter: Payer: Self-pay | Admitting: Gastroenterology

## 2020-06-17 ENCOUNTER — Other Ambulatory Visit: Payer: 59

## 2020-06-17 ENCOUNTER — Other Ambulatory Visit: Payer: Self-pay

## 2020-06-17 DIAGNOSIS — Z20822 Contact with and (suspected) exposure to covid-19: Secondary | ICD-10-CM

## 2020-06-18 LAB — SARS-COV-2, NAA 2 DAY TAT

## 2020-06-18 LAB — NOVEL CORONAVIRUS, NAA: SARS-CoV-2, NAA: NOT DETECTED

## 2020-07-07 ENCOUNTER — Encounter: Payer: Self-pay | Admitting: Neurology

## 2020-07-12 ENCOUNTER — Encounter: Payer: Self-pay | Admitting: Adult Health

## 2020-07-12 ENCOUNTER — Other Ambulatory Visit: Payer: Self-pay

## 2020-07-12 ENCOUNTER — Ambulatory Visit: Payer: 59 | Admitting: Adult Health

## 2020-07-12 VITALS — BP 134/90 | HR 62 | Ht 72.0 in | Wt 252.6 lb

## 2020-07-12 DIAGNOSIS — R569 Unspecified convulsions: Secondary | ICD-10-CM

## 2020-07-12 DIAGNOSIS — R413 Other amnesia: Secondary | ICD-10-CM | POA: Diagnosis not present

## 2020-07-12 DIAGNOSIS — Z9989 Dependence on other enabling machines and devices: Secondary | ICD-10-CM | POA: Diagnosis not present

## 2020-07-12 DIAGNOSIS — G4733 Obstructive sleep apnea (adult) (pediatric): Secondary | ICD-10-CM | POA: Diagnosis not present

## 2020-07-12 MED ORDER — LEVETIRACETAM ER 750 MG PO TB24: 1500.0000 mg | ORAL_TABLET | Freq: Every evening | ORAL | 3 refills | Status: DC

## 2020-07-12 NOTE — Progress Notes (Signed)
PATIENT: Manuel Hartman DOB: 07/20/1966  REASON FOR VISIT: follow up HISTORY FROM: patient  HISTORY OF PRESENT ILLNESS: Today 07/12/20:  Manuel Hartman is a 54 year old male with a history of obstructive sleep apnea on CPAP.  His download indicates that he uses machine 24 out of 30 days for compliance of 80%.  He uses machine greater than 4 hours 21 days for compliance of 70%.  On average he uses his machine 6 hours and 19 minutes.  His residual AHI is 2.3 on 6 to 16 cm of water with EPR 3.  Leak in the 95th percentile is 11.6 L/min. He reports that his wife has enjoyed him using the CPAP as he no longer snores.  He is not sure that he can tell a difference at this time.  Patient feels that his memory has remained the same.  He does not have issues with short-term memory.  But rather long-term.  He states remembering names is an issue for him.  But these are not people that he sees every day.  These are people that he sees maybe 2 or 3 times a year and cannot remember their names.  He is also had occasions where he cannot remember that he has already met people before.  He returns today for an evaluation.  He continues on Keppra for seizures.  HISTORY Manuel Hartman a 54 year old Caucasian male patientand seen  In a face to face consultation on 09/17/2019 initiated by Ward Givens, NP. He is established as a seizure patient with Dr Jannifer Franklin, has epilepsy since age 73.   Chiefconcernaccording to patient : "I may have apnea. "  I have the pleasure of seeing Manuel Hartman today,a right-handed Caucasian male with a possible sleep disorder. he   has a past medical history of HYPERLIPIDEMIA (06/28/2007) and SEIZURE DISORDER (06/28/2007).  Sleeprelevant medical history:  Night terrors as a child, Tonsillectomy in childhood, had some concussion.Familymedical /sleep history: 2 brothers  on CPAP with OSA, no insomnia, no sleep walkers. 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  The patient currently works in daytime form office. Pets are present- 3 dogs. Tobacco use; 1 year in college. ETOH use: on weekends 2-4 glasses., Caffeine intake in form of Coffee( 3 cups in AM ) Soda( one a day ) Tea ( decaffeinated ) and [employer] or [employer]  energy drinks. Regular exercise: none.   Hobbies : Tennis    Sleep habits are as follows:The patient's dinner time is between 7.30 PM. The patient goes to bed at 11.30 PM and watches TV until midnight- continues to sleep for several  hours, wakes rarely  for bathroom breaks. The preferred sleep position is laterrally and prone, with the support of 2 pillows.  Dreams are reportedly rare.  6.30 AM is the usual rise time.  The patient wakes up with an alarm or by his dogs.Marland Kitchen  He reports often not feeling refreshed or restored in AM, with a desire to sleep another hour-  symptoms such as dry mouth , throat is sore, morning headaches and residual fatigue.  Naps are taken rarely-   REVIEW OF SYSTEMS: Out of a complete 14 system review of symptoms, the patient complains only of the following symptoms, and all other reviewed systems are negative.  See HPI  ALLERGIES: Allergies  Allergen Reactions  . Penicillins Other (  See Comments)    chills    HOME MEDICATIONS: Outpatient Medications Prior to Visit  Medication Sig Dispense Refill  . atorvastatin (LIPITOR) 40 MG tablet Take 40 mg by mouth daily.    . colchicine 0.6 MG tablet Take 0.6 mg by mouth 2 (two) times daily as needed.    . Ibuprofen (ADVIL PO) Take by mouth.    . Levetiracetam 750 MG TB24 TAKE 2 TABLETS (1,500 MG TOTAL) BY MOUTH EVERY EVENING. 180 tablet 3  . propranolol (INDERAL) 20 MG tablet TAKE 1 TABLET (20 MG TOTAL) BY MOUTH AS NEEDED. 30 tablet 5  . sertraline (ZOLOFT) 25 MG tablet Take 25 mg by mouth daily.    . meloxicam (MOBIC) 15  MG tablet TAKE 1 TABLET BY MOUTH EVERY DAY 30 tablet 0  . amphetamine-dextroamphetamine (ADDERALL) 20 MG tablet Take 1 tablet (20 mg total) by mouth daily as needed. (Patient not taking: Reported on 01/08/2020) 30 tablet 0   No facility-administered medications prior to visit.    PAST MEDICAL HISTORY: Past Medical History:  Diagnosis Date  . Anxiety   . Arthritis   . HYPERLIPIDEMIA 06/28/2007  . SEIZURE DISORDER 06/28/2007  . Shoulder pain   . Sleep apnea    pending CPAP    PAST SURGICAL HISTORY: Past Surgical History:  Procedure Laterality Date  . APPENDECTOMY  1994  . SURGERY SCROTAL / TESTICULAR    . 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 Cancer Mother        gastric  . Stomach cancer Mother 68  . Heart disease Father 72       fatal mi  . Cancer Sister        breast  . Coronary artery disease Paternal Uncle 66  . Colon cancer Neg Hx   . Colon polyps Neg Hx   . Esophageal cancer Neg Hx   . Rectal cancer Neg Hx     SOCIAL HISTORY: Social History   Socioeconomic History  . Marital status: Married    Spouse name: Not on file  . Number of children: 2  . Years of education: MA  . Highest education level: Not on file  Occupational History  . Occupation: Pharmaceuticals  Tobacco Use  . Smoking status: Light Tobacco Smoker    Types: Cigars  . Smokeless tobacco: Never Used  Vaping Use  . Vaping Use: Never used  Substance and Sexual Activity  . Alcohol use: Yes  . Drug use: No  . Sexual activity: Not on file  Other Topics Concern  . Not on file  Social History Narrative   Lives at home w/ wife and 2 children.   Patient is right handed.   Patient drinks some caffeine daily   Social Determinants of Health   Financial Resource Strain:   . Difficulty of Paying Living Expenses: Not on file  Food Insecurity:   . Worried About Charity fundraiser in the Last Year: Not on file  . Ran Out  of Food in the Last Year: Not on file  Transportation Needs:   . Lack of Transportation (Medical): Not on file  . Lack of Transportation (Non-Medical): Not on file  Physical Activity:   . Days of Exercise per Week: Not on file  . Minutes of Exercise per Session: Not on file  Stress:   . Feeling of Stress : Not on file  Social  Connections:   . Frequency of Communication with Friends and Family: Not on file  . Frequency of Social Gatherings with Friends and Family: Not on file  . Attends Religious Services: Not on file  . Active Member of Clubs or Organizations: Not on file  . Attends Archivist Meetings: Not on file  . Marital Status: Not on file  Intimate Partner Violence:   . Fear of Current or Ex-Partner: Not on file  . Emotionally Abused: Not on file  . Physically Abused: Not on file  . Sexually Abused: Not on file      PHYSICAL EXAM  Vitals:   07/12/20 0925  BP: 134/90  Pulse: 62  Weight: 252 lb 9.6 oz (114.6 kg)  Height: 6' (1.829 m)   Body mass index is 34.26 kg/m.  Generalized: Well developed, in no acute distress  Chest: Lungs clear to auscultation bilaterally  Neurological examination  Mentation: Alert oriented to time, place, history taking. Follows all commands speech and language fluent Cranial nerve II-XII: Extraocular movements were full, visual field were full on confrontational test Head turning and shoulder shrug  were normal and symmetric. Motor: The motor testing reveals 5 over 5 strength of all 4 extremities. Good symmetric motor tone is noted throughout.  Sensory: Sensory testing is intact to soft touch on all 4 extremities. No evidence of extinction is noted.  Gait and station: Gait is normal.    DIAGNOSTIC DATA (LABS, IMAGING, TESTING) - I reviewed patient records, labs, notes, testing and imaging myself where available.  Lab Results  Component Value Date   WBC 7.5 07/09/2019   HGB 13.4 07/09/2019   HCT 40.4 07/09/2019   MCV 94  07/09/2019   PLT 226 07/09/2019      Component Value Date/Time   NA 137 07/09/2019 0956   K 4.3 07/09/2019 0956   CL 99 07/09/2019 0956   CO2 19 (L) 07/09/2019 0956   GLUCOSE 96 07/09/2019 0956   GLUCOSE 93 08/28/2013 0912   BUN 17 07/09/2019 0956   CREATININE 0.92 07/09/2019 0956   CALCIUM 9.4 07/09/2019 0956   PROT 7.3 07/09/2019 0956   ALBUMIN 4.8 07/09/2019 0956   AST 33 07/09/2019 0956   ALT 40 07/09/2019 0956   ALKPHOS 56 07/09/2019 0956   BILITOT 0.4 07/09/2019 0956   GFRNONAA 94 07/09/2019 0956   GFRAA 109 07/09/2019 0956   Lab Results  Component Value Date   CHOL 178 04/23/2015   HDL 44.80 04/23/2015   LDLCALC 107 (H) 04/23/2015   TRIG 130.0 04/23/2015   CHOLHDL 4 04/23/2015   No results found for: HGBA1C Lab Results  Component Value Date   VITAMINB12 707 07/09/2019   Lab Results  Component Value Date   TSH 3.030 07/09/2019      ASSESSMENT AND PLAN 54 y.o. year old male  has a past medical history of Anxiety, Arthritis, HYPERLIPIDEMIA (06/28/2007), SEIZURE DISORDER (06/28/2007), Shoulder pain, and Sleep apnea. here with:  1. OSA on CPAP   CPAP compliance excellent  Good treatment of AHI   Encourage patient to use CPAP nightly and > 4 hours each night  2.  Seizures   Continue Keppra   3.  Memory disturbance   Discussed referral for neuropsychological testing however he deferred for now.  - F/U in 1 year or sooner if needed   I spent 25 minutes of face-to-face and non-face-to-face time with patient.  This included previsit chart review, lab review, study review, order entry, electronic health record  documentation, patient education.  Ward Givens, MSN, NP-C 07/12/2020, 9:27 AM [facility] [address] [place], [place] [address] [phone]

## 2020-07-12 NOTE — Patient Instructions (Signed)
Your Plan:  Continue Keppra Continue CPAP If your symptoms worsen or you develop new symptoms please let us know.    Thank you for coming to see [place] at [facility]. I hope we have been able to provide you high quality care today.  You may receive a patient satisfaction survey over the next few weeks. We would appreciate your feedback and comments so that we may continue to improve ourselves and the health of our patients.

## 2020-07-20 ENCOUNTER — Other Ambulatory Visit: Payer: Self-pay | Admitting: Adult Health

## 2020-07-23 ENCOUNTER — Ambulatory Visit (AMBULATORY_SURGERY_CENTER): Payer: Self-pay

## 2020-07-23 ENCOUNTER — Other Ambulatory Visit: Payer: Self-pay

## 2020-07-23 ENCOUNTER — Encounter: Payer: Self-pay | Admitting: Gastroenterology

## 2020-07-23 VITALS — Ht 72.0 in | Wt 253.0 lb

## 2020-07-23 DIAGNOSIS — Z1211 Encounter for screening for malignant neoplasm of colon: Secondary | ICD-10-CM

## 2020-07-23 MED ORDER — SUTAB 1479-225-188 MG PO TABS
1.0000 | ORAL_TABLET | ORAL | 0 refills | Status: DC
Start: 2020-07-23 — End: 2023-04-24

## 2020-07-23 NOTE — Progress Notes (Signed)
No egg or soy allergy known to patient  No issues with past sedation with any surgeries or procedures No intubation problems in the past  No FH of Malignant Hyperthermia No diet pills per patient No home 02 use per patient  No blood thinners per patient  Pt denies issues with constipation  No A fib or A flutter  EMMI video via MyChart  COVID 19 guidelines implemented in PV today with Pt and RN  Coupon given to pt in PV today , Code to Pharmacy  COVID vaccines completed on 12/2019 per pt;  Due to the COVID-19 pandemic we are asking patients to follow these guidelines. Please only bring one care partner. Please be aware that your care partner may wait in the car in the parking lot or if they feel like they will be too hot to wait in the car, they may wait in the lobby on the 4th floor. All care partners are required to wear a mask the entire time (we do not have any that we can provide them), they need to practice social distancing, and we will do a Covid check for all patient's and care partners when you arrive. Also we will check their temperature and your temperature. If the care partner waits in their car they need to stay in the parking lot the entire time and we will call them on their cell phone when the patient is ready for discharge so they can bring the car to the front of the building. Also all patient's will need to wear a mask into building.  

## 2020-08-10 ENCOUNTER — Encounter: Payer: 59 | Admitting: Gastroenterology

## 2020-11-29 ENCOUNTER — Other Ambulatory Visit: Payer: Self-pay | Admitting: Adult Health

## 2020-12-01 ENCOUNTER — Other Ambulatory Visit: Payer: Self-pay | Admitting: Adult Health

## 2021-03-04 ENCOUNTER — Other Ambulatory Visit: Payer: Self-pay | Admitting: Adult Health

## 2021-03-07 ENCOUNTER — Telehealth: Payer: Self-pay | Admitting: Adult Health

## 2021-03-07 NOTE — Telephone Encounter (Signed)
Pt has called stating he is out of his Levetiracetam 750 MG TB24.  The pt states he got a 5 day emergency supply from the pharmacy. Pt states If anything happens to him while without this medication it is the entire fault of the office.  Please call

## 2021-03-08 ENCOUNTER — Other Ambulatory Visit: Payer: Self-pay | Admitting: Adult Health

## 2021-03-08 NOTE — Telephone Encounter (Signed)
I called pt Manuel Hartman for him that returned call.  [facility] a year from 07-12-2020, I tried to call and was not able to reach a person, will try again or did he want to go to another pharmacy?

## 2021-03-08 NOTE — Telephone Encounter (Signed)
Spoke to pt and did [facility].  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   I relayed that will [facility] at this point.

## 2021-03-08 NOTE — Telephone Encounter (Signed)
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  This was the first time I have gotten call.  [facility] year 07-12-20, not sure what is problem, but sent prescription to local per pt requests. (wanted to go to both mail and local).  I FILL TO LOCAL. FOR A YEAR.Marland Kitchen

## 2021-07-18 ENCOUNTER — Ambulatory Visit: Payer: 59 | Admitting: Adult Health

## 2021-07-28 ENCOUNTER — Encounter: Payer: Self-pay | Admitting: Neurology

## 2021-07-28 ENCOUNTER — Telehealth: Payer: Self-pay | Admitting: Adult Health

## 2021-07-28 ENCOUNTER — Telehealth (INDEPENDENT_AMBULATORY_CARE_PROVIDER_SITE_OTHER): Payer: 59 | Admitting: Neurology

## 2021-07-28 DIAGNOSIS — R569 Unspecified convulsions: Secondary | ICD-10-CM | POA: Diagnosis not present

## 2021-07-28 DIAGNOSIS — F909 Attention-deficit hyperactivity disorder, unspecified type: Secondary | ICD-10-CM

## 2021-07-28 NOTE — Progress Notes (Signed)
    Virtual Visit via Telephone Note  I connected with Manuel Hartman on 07/28/21 at 10:00 AM EDT by telephone and verified that I am speaking with the correct person using two identifiers.  Location: Patient: The patient is at home Provider: Physician in office   I discussed the limitations, risks, security and privacy concerns of performing an evaluation and management service by telephone and the availability of in person appointments. I also discussed with the patient that there may be a patient responsible charge related to this service. The patient expressed understanding and agreed to proceed.   History of Present Illness: Mr. Manuel Hartman is a 55 year old right-handed white male with a history of seizures that have been well controlled on Keppra.  He tolerates medication fairly well with exception of some mild irritability, he is on low-dose Zoloft 25 mg daily which seems to help.  He is actively working for [employer], he operates a Librarian, academic.  He denies any significant drowsiness on the medication.  He does have a history of ADD but is not on medications for this and claims that his ability to focus has improved over time.  He reports no other significant medical issues, he does have a history of sleep apnea on CPAP.   Observations/Objective: Via telephone interview reveals that the patient is bright and alert, answering questions appropriately, speech is well enunciated, not aphasic or dysarthric.  Assessment and Plan: 1.  History of seizures, well controlled  The patient is doing well on the Keppra, will continue the medication for now.  He will follow-up here in 1 year.  Follow Up Instructions:  1 year follow-up, may see nurse practitioner, may be followed through Dr. Vickey Huger in the future.   I discussed the assessment and treatment plan with the patient. The patient was provided an opportunity to ask questions and all were answered. The patient agreed with the  plan and demonstrated an understanding of the instructions.   The patient was advised to call back or seek an in-person evaluation if the symptoms worsen or if the condition fails to improve as anticipated.  I provided 15 minutes of non-face-to-face time during this encounter.   Manuel Spaniel, MD

## 2021-07-28 NOTE — Telephone Encounter (Signed)
..   Pt understands that although there may be some limitations with this type of visit, we will take all precautions to reduce any security or privacy concerns.  Pt understands that this will be treated like an in office visit and we will file with pt's insurance, and there may be a patient responsible charge related to this service. ? ?

## 2021-07-28 NOTE — Telephone Encounter (Signed)
Noted  

## 2022-01-05 ENCOUNTER — Other Ambulatory Visit: Payer: Self-pay | Admitting: Internal Medicine

## 2022-01-05 DIAGNOSIS — E785 Hyperlipidemia, unspecified: Secondary | ICD-10-CM

## 2022-01-18 ENCOUNTER — Other Ambulatory Visit: Payer: Self-pay

## 2022-01-18 ENCOUNTER — Ambulatory Visit
Admission: RE | Admit: 2022-01-18 | Discharge: 2022-01-18 | Disposition: A | Discharge status: Home / Self Care | Payer: No Typology Code available for payment source | Source: Ambulatory Visit | Attending: Internal Medicine | Admitting: Internal Medicine

## 2022-01-18 DIAGNOSIS — E785 Hyperlipidemia, unspecified: Secondary | ICD-10-CM

## 2022-08-07 IMAGING — CT CT CARDIAC CORONARY ARTERY CALCIUM SCORE
2 of 3 series · 12 of 20 positions shown, 15 images · non-contrast
Comparison: None.

CLINICAL DATA: 55-year-old Caucasian male with history of
hyperlipidemia, hypertension, family history of heart disease and
prior smoking history.

EXAM:
CT CARDIAC CORONARY ARTERY CALCIUM SCORE
TECHNIQUE: Non-contrast imaging through the heart was performed using
prospective ECG gating. Image post processing was performed on an
independent workstation, allowing for quantitative analysis of the
heart and coronary arteries. Note that this exam targets the heart
and the chest was not imaged in its entirety.

[Series 2: cascseq 2.0 d10f 70% · axial · 0.39mm/px · z∈[-159,-75]mm · 3 of 86 slices shown]
[im 22/86  vessel]
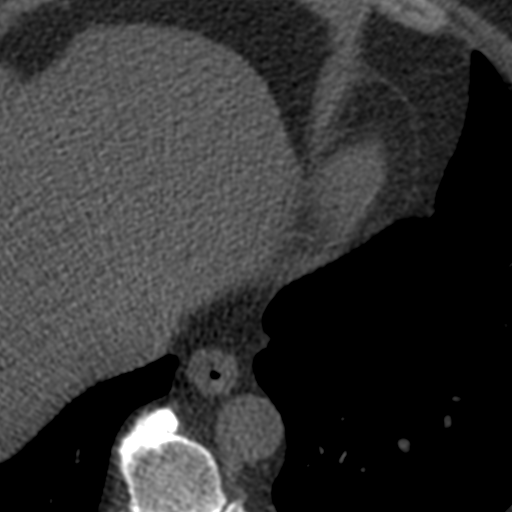
[im 43/86  vessel]
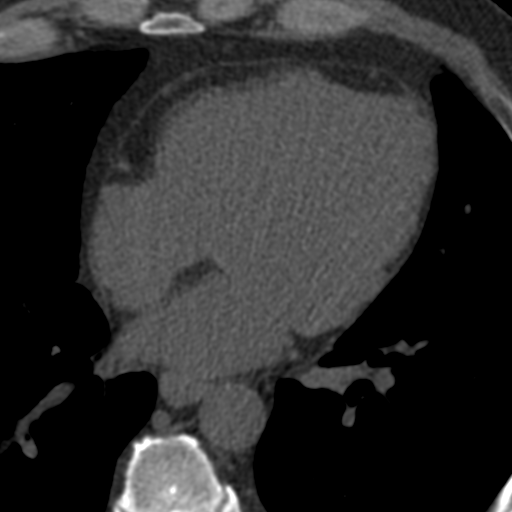
[im 64/86  vessel]
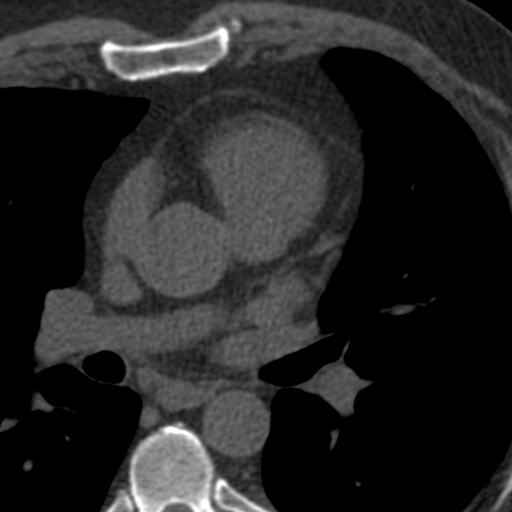

[Series 3: thin sfov · axial · 0.39mm/px · z∈[-185,-49]mm · 9 of 172 slices shown, 12 images]
[im 18/172  vessel]
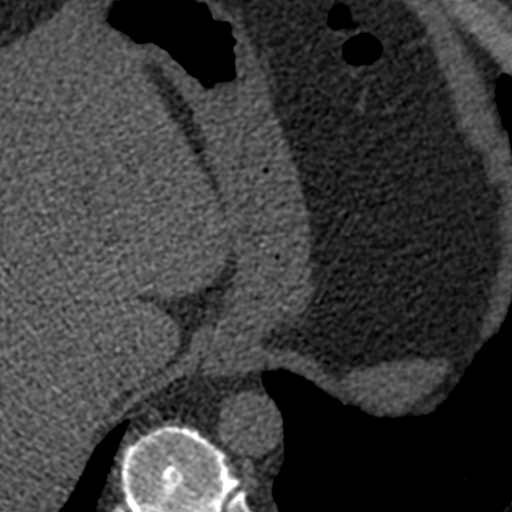
[im 18/172  lung]
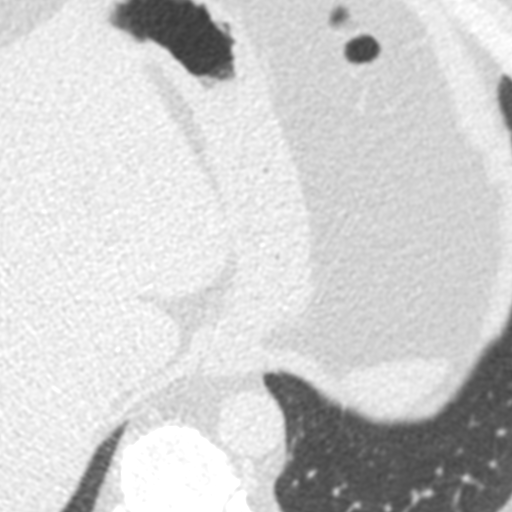
[im 35/172  vessel]
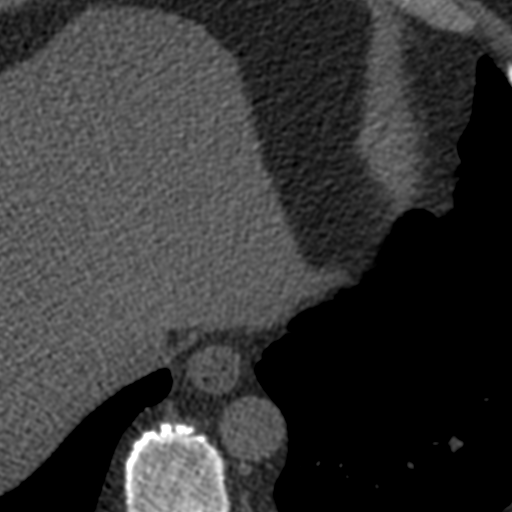
[im 52/172  vessel]
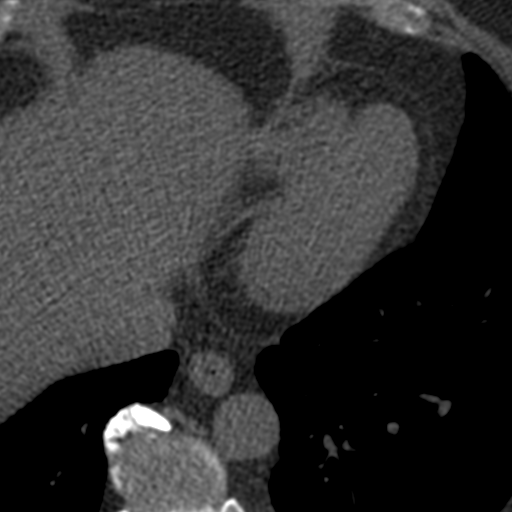
[im 69/172  vessel]
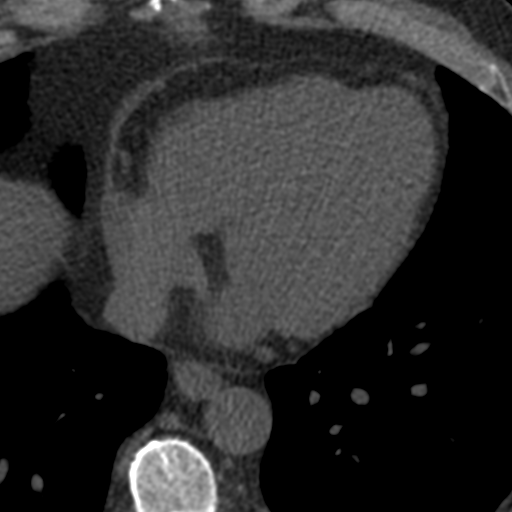
[im 86/172  vessel]
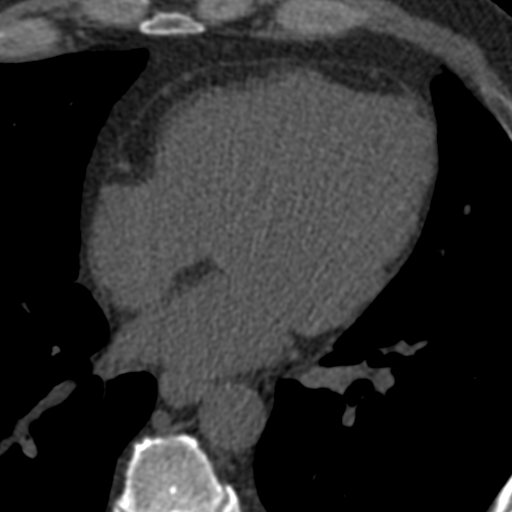
[im 86/172  lung]
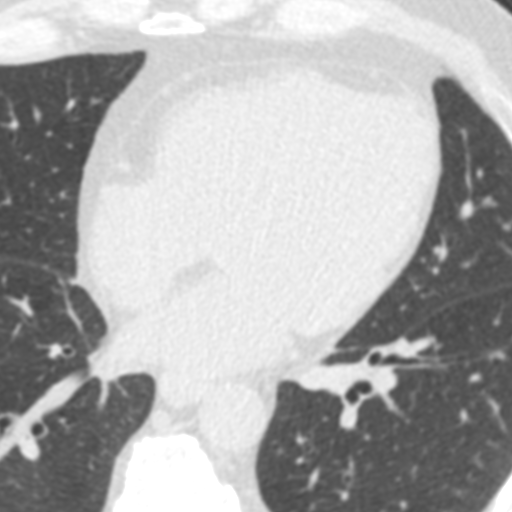
[im 103/172  vessel]
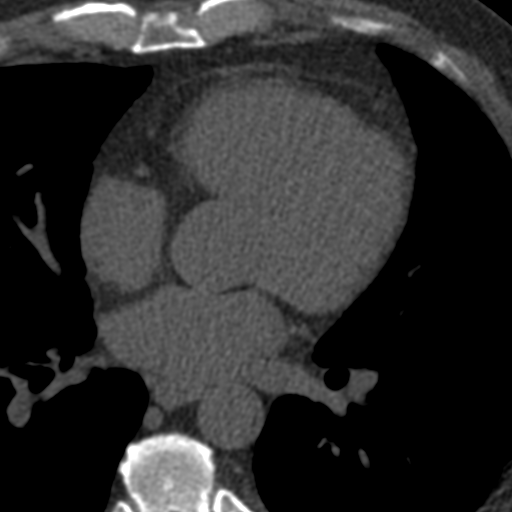
[im 120/172  vessel]
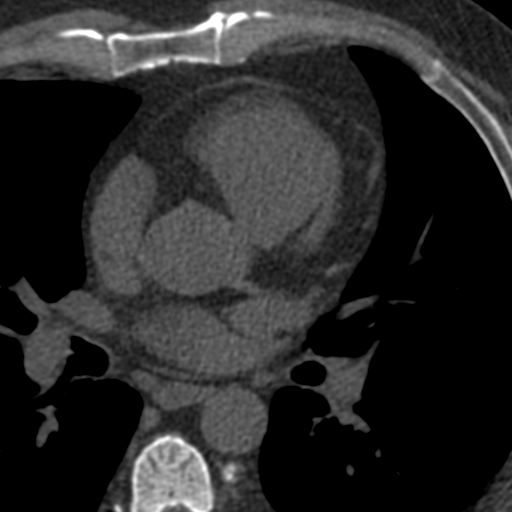
[im 137/172  vessel]
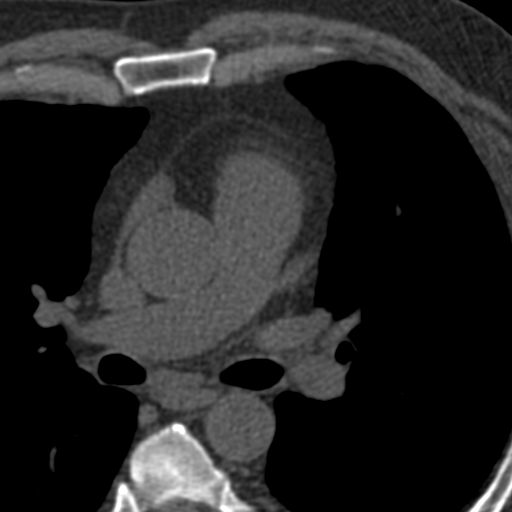
[im 154/172  vessel]
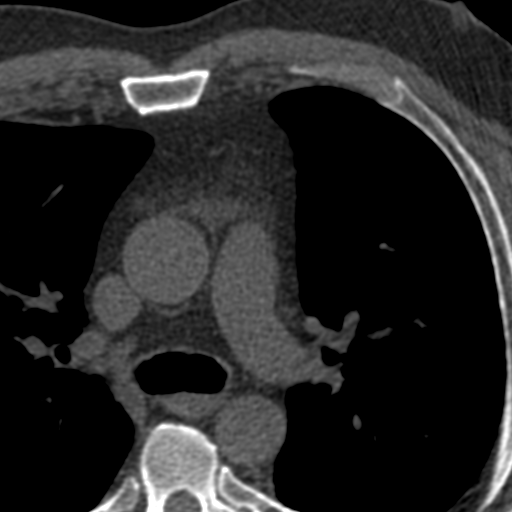
[im 154/172  lung]
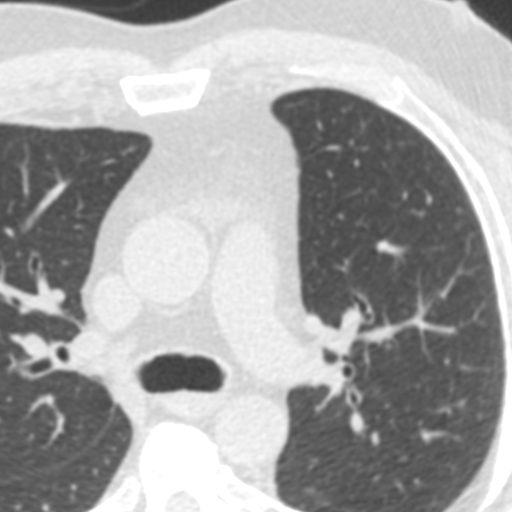

[12 of 20 positions shown; findings below may reference images not displayed]

FINDINGS: CORONARY CALCIUM SCORES:

Left Main: 0

LAD: 0

LCx: 0

RCA: 0

Total Agatston Score: 0

[HOSPITAL] percentile: 0

AORTA MEASUREMENTS:

Ascending Aorta: 36 mm

Descending Aorta: 25 mm

OTHER FINDINGS:

The heart size is within normal limits. There are some focal
calcifications associated with the aortic valve. No pericardial
fluid is identified. Visualized segments of the thoracic aorta and
central pulmonary arteries are normal in caliber. Visualized
mediastinum and hilar regions demonstrate no lymphadenopathy or
masses. Visualized lungs show no evidence of pulmonary edema,
consolidation, pneumothorax, nodule or pleural fluid. The visualized
liver demonstrates evidence of probable mild steatosis. Visualized
bony structures are unremarkable.
IMPRESSION: 1. Coronary calcium score of 0.
2. Focal calcifications of the aortic valve.
3. Probable hepatic steatosis.

## 2023-03-12 ENCOUNTER — Encounter: Payer: Self-pay | Admitting: Gastroenterology

## 2023-03-29 NOTE — Progress Notes (Signed)
[facility] Pre-Visit Note  Pre visit Date 03/29/23     Manuel Hartman    [id number]    [birth year]  Primary Care Physician:Perini, Loraine Leriche, MD  Referring Physician: Rodrigo Ran, MD [address] [place],  [place] [address]  Pre visit In Person/ completed via phone call *** Direct / Recall direct  Last Colonoscopy: NA  Last office visit: NA- had PV in 2021 but cancelled procedure  Procedure: colonoscopy Date of procedure:  Tuesday 04-24-23 Arrival time 12:30 pm     Procedure time 1:30 pm Procedure MD:  Armbruster Procedure Diagnosis Code:  z12.11  Blood thinners: Yes/No  no  Bowel Prep:  Suprep  Constipation: Yes/No  ***  Insurance:  [employer] [facility]  Patient instructed to use Singlecare.com or GoodRx for a price reduction on prep  There were no vitals filed for this visit.  Last BMI   34.31  Height:  6'0" Weight:  ***   No egg or soy allergy known to patient No issues known to patient with past sedation with any surgeries or procedures No history of difficulty with intubation No FH of Malignant Hyperthermia No weight loss medications or diet pills Patient is not on home 02 No history of A fib or A flutter Pt denies trouble ambulating  ECHO/ EF:  no AICD/Pacemaker:  no  Any cardiac testing pending: ***  Allergies as of 04/03/2023 - Review Complete 07/28/2021  Allergen Reaction Noted   Penicillins Other (See Comments) 10/11/2014     MEDICATIONs Instructed to HOLD:  no   Outpatient Encounter Medications as of 04/03/2023  Medication Sig   atorvastatin (LIPITOR) 40 MG tablet Take 40 mg by mouth daily.   colchicine 0.6 MG tablet Take 0.6 mg by mouth 2 (two) times daily as needed.   Ibuprofen (ADVIL PO) Take by mouth daily as needed.    Levetiracetam 750 MG TB24 TAKE 2 TABLETS (1,500 MG TOTAL) BY MOUTH EVERY EVENING.   propranolol (INDERAL) 20 MG tablet TAKE 1 TABLET (20 MG TOTAL) BY MOUTH AS NEEDED.   sertraline (ZOLOFT) 25 MG tablet Take  25 mg by mouth daily.   Sodium Sulfate-Mag Sulfate-KCl (SUTAB) [phone] MG TABS Take 1 kit by mouth as directed. MANUFACTURER CODES!! BIN: [id number] PCN: CN GROUP: [id number] MEMBER ID: [id number];LKG AS CASH;NO PRIOR AUTHORIZATION   telmisartan (MICARDIS) 20 MG tablet Take 20 mg by mouth daily.   No facility-administered encounter medications on file as of 04/03/2023.   LAST SEIZURE ***  Past Medical History:  Diagnosis Date   Anxiety    on meds   Arthritis    gout associated   HYPERLIPIDEMIA 06/28/2007   on meds   SEIZURE DISORDER 06/28/2007   on meds-last seziure in 1998 per pt report   Shoulder pain    left   Sleep apnea    uses CPAP     Fall Risk:      No data to display           Patient's chart reviewed by Cathlyn Parsons CNRA prior to previsit and patient is appropriate to undergo procedure in [facility].  Previsit completed and red dot placed by patient's name on their procedure day (on provider's schedule).      To be filled in admitting on day of procedure:  BP:               HR:                O2 Sat:  Temp:                       Blood glucose:  Blood Thinners:  Last Dose:

## 2023-04-03 ENCOUNTER — Ambulatory Visit (AMBULATORY_SURGERY_CENTER): Payer: 59

## 2023-04-03 VITALS — Ht 72.0 in | Wt 250.0 lb

## 2023-04-03 DIAGNOSIS — Z1211 Encounter for screening for malignant neoplasm of colon: Secondary | ICD-10-CM

## 2023-04-03 MED ORDER — NA SULFATE-K SULFATE-MG SULF 17.5-3.13-1.6 GM/177ML PO SOLN: 1.0000 | Freq: Once | ORAL | 0 refills | Status: AC

## 2023-04-10 ENCOUNTER — Encounter: Payer: Self-pay | Admitting: Gastroenterology

## 2023-04-24 ENCOUNTER — Ambulatory Visit: Payer: 59 | Admitting: Gastroenterology

## 2023-04-24 ENCOUNTER — Telehealth: Payer: Self-pay | Admitting: Gastroenterology

## 2023-04-24 ENCOUNTER — Encounter: Payer: Self-pay | Admitting: Gastroenterology

## 2023-04-24 VITALS — BP 115/78 | HR 66 | Temp 98.3°F | Resp 20 | Ht 72.0 in | Wt 250.0 lb

## 2023-04-24 DIAGNOSIS — D123 Benign neoplasm of transverse colon: Secondary | ICD-10-CM | POA: Diagnosis not present

## 2023-04-24 DIAGNOSIS — D127 Benign neoplasm of rectosigmoid junction: Secondary | ICD-10-CM

## 2023-04-24 DIAGNOSIS — D124 Benign neoplasm of descending colon: Secondary | ICD-10-CM | POA: Diagnosis not present

## 2023-04-24 DIAGNOSIS — K6289 Other specified diseases of anus and rectum: Secondary | ICD-10-CM | POA: Diagnosis not present

## 2023-04-24 DIAGNOSIS — D125 Benign neoplasm of sigmoid colon: Secondary | ICD-10-CM | POA: Diagnosis not present

## 2023-04-24 DIAGNOSIS — Z1211 Encounter for screening for malignant neoplasm of colon: Secondary | ICD-10-CM | POA: Diagnosis present

## 2023-04-24 DIAGNOSIS — K635 Polyp of colon: Secondary | ICD-10-CM | POA: Diagnosis not present

## 2023-04-24 DIAGNOSIS — D120 Benign neoplasm of cecum: Secondary | ICD-10-CM | POA: Diagnosis not present

## 2023-04-24 DIAGNOSIS — D128 Benign neoplasm of rectum: Secondary | ICD-10-CM

## 2023-04-24 MED ORDER — SODIUM CHLORIDE 0.9 % IV SOLN: 500.0000 mL | Freq: Once | INTRAVENOUS | Status: DC

## 2023-04-24 NOTE — Telephone Encounter (Signed)
Returned call to patient. He drank all of the prescribed prep and reports clear yellow results. Patient ensured that he is ok to come for his colonoscopy today. Encouraged patient to continue take in clear liquids until 1030 am.patient verbalized understanding and agreed to POC.

## 2023-04-24 NOTE — Op Note (Signed)
[facility] Patient Name: Manuel Hartman Procedure Date: 04/24/2023 5:48 PM MRN: [id number] Endoscopist: Viviann Spare P. Adela Lank , MD, [id number] Age: 57 Referring MD:  Date of Birth: Dec 25, 1965 Gender: Male Account #: [id number] Procedure:                Colonoscopy Indications:              Screening for colorectal malignant neoplasm, This                            is the patient's first colonoscopy - PROVATION WAS                            DOWN FOR THIS EXAM - NO PHOTOS WERE ABLE TO BE TAKEN Medicines:                Monitored Anesthesia Care Procedure:                Pre-Anesthesia Assessment:                           - Prior to the procedure, a History and Physical                            was performed, and patient medications and                            allergies were reviewed. The patient's tolerance of                            previous anesthesia was also reviewed. The risks                            and benefits of the procedure and the sedation                            options and risks were discussed with the patient.                            All questions were answered, and informed consent                            was obtained. Prior Anticoagulants: The patient has                            taken no anticoagulant or antiplatelet agents. ASA                            Grade Assessment: II - A patient with mild systemic                            disease. After reviewing the risks and benefits,                            the patient was deemed in satisfactory condition to  undergo the procedure.                           After obtaining informed consent, the colonoscope                            was passed under direct vision. Throughout the                            procedure, the patient's blood pressure, pulse, and                            oxygen saturations were monitored continuously. The                             CF [id number] #[id number] was introduced through the anus                            and advanced to the the cecum, identified by                            appendiceal orifice and ileocecal valve. The                            colonoscopy was performed without difficulty. The                            patient tolerated the procedure well. The quality                            of the bowel preparation was good. The ileocecal                            valve, appendiceal orifice, and rectum were                            photographed. Findings:                 The perianal and digital rectal examinations were                            normal.                           A 3 mm polyp was found in the cecum. The polyp was                            sessile. The polyp was removed with a cold snare.                            Resection and retrieval were complete.                           A 5 mm polyp was found in the hepatic flexure. The  polyp was sessile. The polyp was removed with a                            cold snare. Resection and retrieval were complete.                           Two sessile polyps were found in the descending                            colon. The polyps were 3 mm in size. These polyps                            were removed with a cold snare. Resection and                            retrieval were complete.                           Two sessile polyps were found in the sigmoid colon.                            The polyps were 3 to 4 mm in size. These polyps                            were removed with a cold snare. Resection and                            retrieval were complete.                           A 3 mm polyp was found in the recto-sigmoid colon.                            The polyp was sessile. The polyp was removed with a                            cold snare. Resection and retrieval were complete.                           Two  sessile polyps were found in the rectum. The                            polyps were 2 to 5 mm in size. These polyps were                            removed with a cold snare. Resection and retrieval                            were complete.                           A few small-mouthed diverticula were found in the  sigmoid colon.                           Anal papilla(e) were hypertrophied. Biopsies were                            taken with a cold forceps for histology to rule out                            AIN.                           Internal hemorrhoids were found during retroflexion.                           The exam was otherwise without abnormality.                           Scope in: 1332                           Cecum reached: 1338                           Scope out: 1401 Complications:            No immediate complications. Estimated blood loss:                            Minimal. Estimated Blood Loss:     Estimated blood loss was minimal. Impression:               - One 3 mm polyp in the cecum, removed with a cold                            snare. Resected and retrieved.                           - One 5 mm polyp at the hepatic flexure, removed                            with a cold snare. Resected and retrieved.                           - Two 3 mm polyps in the descending colon, removed                            with a cold snare. Resected and retrieved.                           - Two 3 to 4 mm polyps in the sigmoid colon,                            removed with a cold snare. Resected and retrieved.                           -  One 3 mm polyp at the recto-sigmoid colon,                            removed with a cold snare. Resected and retrieved.                           - Two 2 to 5 mm polyps in the rectum, removed with                            a cold snare. Resected and retrieved.                           - Diverticulosis in the sigmoid  colon.                           - Anal papilla(e) were hypertrophied. Biopsied.                           - Internal hemorrhoids.                           - The examination was otherwise normal. Recommendation:           - Patient has a contact number available for                            emergencies. The signs and symptoms of potential                            delayed complications were discussed with the                            patient. Return to normal activities tomorrow.                            Written discharge instructions were provided to the                            patient.                           - Resume previous diet.                           - Continue present medications.                           - Await pathology results. Viviann Spare P. Aquilla Voiles, MD 04/24/2023 5:53:14 PM This report has been signed electronically.

## 2023-04-24 NOTE — Progress Notes (Signed)
[facility] History and Physical   Primary Care Physician:  Rodrigo Ran, MD   Reason for Procedure:   Colon cancer screening  Plan:    colonoscopy     HPI: Manuel Hartman is a 57 y.o. male  here for colonoscopy screening  - first time exam.   Patient denies any bowel symptoms at this time. No family history of colon cancer known. Otherwise feels well without any cardiopulmonary symptoms.   I have discussed risks / benefits of anesthesia and endoscopic procedure with Mardee Postin and they wish to proceed with the exams as outlined today.    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 Authorizing Provider  albuterol (VENTOLIN HFA) 108 (90 Base) MCG/ACT inhaler INHALE 2 PUFFS EVERY 4 HOURS AS NEEDED FOR WHEEZE, COUGH OR SHORTNESS OF BREATH 03/12/22  Yes [provider]  allopurinol (ZYLOPRIM) 100 MG tablet Take 200 mg by mouth daily.   Yes [provider]  atorvastatin (LIPITOR) 40 MG tablet Take 40 mg by mouth daily.   Yes [provider]  beclomethasone (QVAR REDIHALER) 80 MCG/ACT inhaler INHALE 1 PUFF TWICE A DAY AND RINSE MOUTH WITH WATER AFTER USE 01/31/22  Yes [provider]  Levetiracetam 750 MG TB24 TAKE 2 TABLETS (1,500 MG TOTAL) BY MOUTH EVERY EVENING. 03/08/21  Yes Millikan, Megan, NP  sertraline (ZOLOFT) 25 MG tablet Take 25 mg by mouth daily. 05/31/19  Yes [provider]  telmisartan (MICARDIS) 40 MG tablet SMARTSIG:1 Tablet(s) By Mouth  Every Evening 03/15/23  Yes [provider]  Testosterone 20.25 MG/ACT (1.62%) GEL Place onto the skin. 01/03/23  Yes [provider]  colchicine 0.6 MG tablet Take 0.6 mg by mouth 2 (two) times daily as needed. 12/12/19   [provider]  Ibuprofen (ADVIL PO) Take by mouth daily as needed.     [provider]  propranolol (INDERAL) 20 MG tablet TAKE 1 TABLET (20 MG TOTAL) BY MOUTH AS NEEDED. 10/21/15   Shirline Frees, NP  WEGOVY 1 MG/0.5ML [facility]  12/29/22   [provider]    Current Outpatient Medications  Medication Sig Dispense Refill   albuterol (VENTOLIN HFA) 108 (90 Base) MCG/ACT inhaler INHALE 2 PUFFS EVERY 4 HOURS AS NEEDED FOR WHEEZE, COUGH OR SHORTNESS OF BREATH     allopurinol (ZYLOPRIM) 100 MG tablet Take 200 mg by mouth daily.     atorvastatin (LIPITOR) 40 MG tablet Take 40 mg by mouth daily.     beclomethasone (QVAR REDIHALER) 80 MCG/ACT inhaler INHALE 1 PUFF TWICE A DAY AND RINSE MOUTH WITH WATER AFTER USE     Levetiracetam 750 MG TB24 TAKE 2 TABLETS (1,500 MG TOTAL) BY MOUTH EVERY EVENING. 180 tablet 3   sertraline (ZOLOFT) 25 MG tablet Take 25 mg by mouth daily.     telmisartan (MICARDIS) 40 MG  tablet SMARTSIG:1 Tablet(s) By Mouth Every Evening     Testosterone 20.25 MG/ACT (1.62%) GEL Place onto the skin.     colchicine 0.6 MG tablet Take 0.6 mg by mouth 2 (two) times daily as needed.     Ibuprofen (ADVIL PO) Take by mouth daily as needed.      propranolol (INDERAL) 20 MG tablet TAKE 1 TABLET (20 MG TOTAL) BY MOUTH AS NEEDED. 30 tablet 5   WEGOVY 1 MG/0.5ML SOAJ      Current Facility-Administered Medications  Medication Dose Route Frequency Provider Last Rate Last Admin   0.9 %  sodium chloride infusion  500 mL Intravenous Once Tristy Udovich, Willaim Rayas, MD        Allergies as of 04/24/2023 - Review Complete 04/24/2023  Allergen Reaction Noted   Penicillins Other (See Comments) 10/11/2014    Family History  Problem Relation Age of  Onset   Stomach cancer Mother 62       gastric CA   Heart disease Father 78       fatal mi   Heart attack Father 41   Cancer Sister        breast   Breast cancer Sister 61   Coronary artery disease Paternal Uncle 98   Colon cancer Neg Hx    Colon polyps Neg Hx    Esophageal cancer Neg Hx    Rectal cancer Neg Hx     Social History   Socioeconomic History   Marital status: Married    Spouse name: Not on file   Number of children: 2   Years of education: MA   Highest education level: Not on file  Occupational History   Occupation: [employer]  Tobacco Use   Smoking status: Former    Types: Cigars   Smokeless tobacco: Never  Vaping Use   Vaping Use: Never used  Substance and Sexual Activity   Alcohol use: Yes    Alcohol/week: 5.0 standard drinks of alcohol    Types: 2 Glasses of wine, 2 Cans of beer, 1 Shots of liquor per week   Drug use: No   Sexual activity: Not on file  Other Topics Concern   Not on file  Social History Narrative   Lives at home w/ wife and 2 children.   Patient is right handed.   Patient drinks some caffeine daily   Social Determinants of Health   Financial Resource Strain: Not on file  Food Insecurity: Not on file  Transportation Needs: Not on file  Physical Activity: Not on file  Stress: Not on file  Social Connections: Not on file  Intimate Partner Violence: Not on file    Review of Systems: All other review of systems negative except as mentioned in the HPI.  Physical Exam: Vital signs BP 104/67   Pulse 77   Temp 98.3 F (36.8 C)   Ht 6' (1.829 m)   Wt 250 lb (113.4 kg)   SpO2 96%   BMI 33.91 kg/m   General:   Alert,  Well-developed, pleasant and cooperative in NAD Lungs:  Clear throughout to auscultation.   Heart:  Regular rate and rhythm Abdomen:  Soft, nontender and nondistended.   Neuro/Psych:  Alert and cooperative. Normal mood and affect. A and O x 3  Harlin Rain, MD [facility]

## 2023-04-24 NOTE — Progress Notes (Signed)
Colonoscopy report - (Provation down for this exam, no photos able to be taken)  Indication - colon cancer screening - first time xam  Scope # last 3 digits - 934  Scope in: 1332 Cecum reached: 1338 Scope out: 1401   Prep adequate  3 mm sessile cecal polyp removed via cold snare 5 mm sessile hepatic flexure polyp removed via cold snare. 2 x 3mm sessile descending colon  polyps 3 mm in size, removed via cold snare 2 x 3-4mm sigmoid colon sessile polyps removed via cold snare. 3 mm rectosigmoid sessile polyp removed via cold snare 2 x 2-5mm sessile rectal polyps removed via cold snare. Sigmoid diverticulosis Hypertrophied anal papilla biopsied to rule out AIN Internal hemorrhoids Exam otherwise normal  Recommend: - resume diet - resume medications - await pathology results  

## 2023-04-24 NOTE — Progress Notes (Signed)
A and O x3. Report to RN. Tolerated MAC anesthesia well. 

## 2023-04-24 NOTE — Progress Notes (Signed)
Unable to print procedure report due to computer system is down.

## 2023-04-24 NOTE — Progress Notes (Signed)
VS by CW  Pt's states no medical or surgical changes since previsit or office visit.  

## 2023-04-24 NOTE — Patient Instructions (Addendum)
Resume your current medicines today.  Read the handouts given to you by your recovery room nurse.   Armbruster, Willaim Rayas, MD Physician Incomplete Roselyn Meier) 2:03 PM    suggestion   Note record is being used elsewhere by user Armbruster, Willaim Rayas, MD since 04/24/2023 2:07 PM EDT on workstation [id number].     Colonoscopy report - (Provation down for this exam, no photos able to be taken)   Scope # last 3 digits - 934   Scope in: 1332 Cecum reached: 1338 Scope out: 1401     Prep adequate   3 mm cecal polyp, sessile, removed. 5 mm sessile hepatic flexure polyp removed with cold snare. 2 x 3mm descending colon sessile polyps 3 mm in size, removed via cold snare 2 x 3-48mm sigmoid colon sessile polyps removed via cold snare. 3 mm rectosigmoid sessile polyp removed via cold snare 2 x 2-20mm rectal polyp removed via cold snare.  YOU HAD AN ENDOSCOPIC PROCEDURE TODAY AT THE [facility]:   Refer to the procedure report that was given to you for any specific questions about what was found during the examination.  If the procedure report does not answer your questions, please call your gastroenterologist to clarify.  If you requested that your care partner not be given the details of your procedure findings, then the procedure report has been included in a sealed envelope for you to review at your convenience later.  YOU SHOULD EXPECT: Some feelings of bloating in the abdomen. Passage of more gas than usual.  Walking can help get rid of the air that was put into your GI tract during the procedure and reduce the bloating. If you had a lower endoscopy (such as a colonoscopy or flexible sigmoidoscopy) you may notice spotting of blood in your stool or on the toilet paper. If you underwent a bowel prep for your procedure, you may not have a normal bowel movement for a few days.  Please Note:  You might notice some irritation and congestion in your nose or some drainage.  This is from the  oxygen used during your procedure.  There is no need for concern and it should clear up in a day or so.  SYMPTOMS TO REPORT IMMEDIATELY:  Following lower endoscopy (colonoscopy or flexible sigmoidoscopy):  Excessive amounts of blood in the stool  Significant tenderness or worsening of abdominal pains  Swelling of the abdomen that is new, acute  Fever of 100F or higher   For urgent or emergent issues, a gastroenterologist can be reached at any hour by calling [phone]. Do not use MyChart messaging for urgent concerns.    DIET:  We do recommend a small meal at first, but then you may proceed to your regular diet.  Drink plenty of fluids but you should avoid alcoholic beverages for 24 hours.  ACTIVITY:  You should plan to take it easy for the rest of today and you should NOT DRIVE or use heavy machinery until tomorrow (because of the sedation medicines used during the test).    FOLLOW UP: Our staff will call the number listed on your records the next business day following your procedure.  We will call around 7:15- 8:00 am to check on you and address any questions or concerns that you may have regarding the information given to you following your procedure. If we do not reach you, we will leave a message.     If any biopsies were taken you will be contacted by  phone or by letter within the next 1-3 weeks.  Please call us at [phone] if you have not heard about the biopsies in 3 weeks.    SIGNATURES/CONFIDENTIALITY: You and/or your care partner have signed paperwork which will be entered into your electronic medical record.  These signatures attest to the fact that that the information above on your After Visit Summary has been reviewed and is understood.  Full responsibility of the confidentiality of this discharge information lies with you and/or your care-partner.         P

## 2023-04-24 NOTE — Telephone Encounter (Signed)
Inbound call from patient stating after taking prep last night and this morning he did not have many bowel movements and does not feel cleared out. Requesting to speak with a nurse because he is worried that the colonoscopy will not be able to be preformed fully today. Please advise, thank you.

## 2023-04-24 NOTE — Progress Notes (Signed)
Colonoscopy report - (Provation down for this exam, no photos able to be taken)  Indication - colon cancer screening - first time xam  Scope # last 3 digits - 934  Scope in: 1332 Cecum reached: 1338 Scope out: 1401   Prep adequate  3 mm sessile cecal polyp removed via cold snare 5 mm sessile hepatic flexure polyp removed via cold snare. 2 x 3mm sessile descending colon  polyps 3 mm in size, removed via cold snare 2 x 3-18mm sigmoid colon sessile polyps removed via cold snare. 3 mm rectosigmoid sessile polyp removed via cold snare 2 x 2-41mm sessile rectal polyps removed via cold snare. Sigmoid diverticulosis Hypertrophied anal papilla biopsied to rule out AIN Internal hemorrhoids Exam otherwise normal  Recommend: - resume diet - resume medications - await pathology results

## 2023-04-24 NOTE — Progress Notes (Signed)
Called to room to assist during endoscopic procedure.  Patient ID and intended procedure confirmed with present staff. Received instructions for my participation in the procedure from the performing physician.  

## 2023-04-25 ENCOUNTER — Telehealth: Payer: Self-pay | Admitting: *Deleted

## 2023-04-25 NOTE — Telephone Encounter (Signed)
Post procedure follow up call placed, no answer and left VM.  

## 2023-04-25 NOTE — Telephone Encounter (Signed)
Patient is returning your call , he does want to go over some concerns.please advise

## 2023-05-07 ENCOUNTER — Encounter: Payer: Self-pay | Admitting: Gastroenterology

## 2023-10-10 ENCOUNTER — Telehealth: Payer: Self-pay | Admitting: Neurology

## 2023-10-10 NOTE — Telephone Encounter (Signed)
Pt called to schedule a f/u appointment

## 2024-01-11 NOTE — Progress Notes (Deleted)
 PATIENT: Manuel Hartman DOB: [birth year]  REASON FOR VISIT: follow up HISTORY FROM: patient  HISTORY OF PRESENT ILLNESS: Today 01/14/24:  Mardee Postin is a 58 y.o. male with a history of ***. Returns today for follow-up.      07/28/21: Mr. Wilbert is a 58 year old right-handed white male with a history of seizures that have been well controlled on Keppra. He tolerates medication fairly well with exception of some mild irritability, he is on low-dose Zoloft 25 mg daily which seems to help. He is actively working for [employer], he operates a Librarian, academic. He denies any significant drowsiness on the medication. He does have a history of ADD but is not on medications for this and claims that his ability to focus has improved over time. He reports no other significant medical issues, he does have a history of sleep apnea on CPAP.   07/12/20: Mr. Hemstreet is a 58 year old male with a history of obstructive sleep apnea on CPAP.  His download indicates that he uses machine 24 out of 30 days for compliance of 80%.  He uses machine greater than 4 hours 21 days for compliance of 70%.  On average he uses his machine 6 hours and 19 minutes.  His residual AHI is 2.3 on 6 to 16 cm of water with EPR 3.  Leak in the 95th percentile is 11.6 L/min. He reports that his wife has enjoyed him using the CPAP as he no longer snores.  He is not sure that he can tell a difference at this time.  Patient feels that his memory has remained the same.  He does not have issues with short-term memory.  But rather long-term.  He states remembering names is an issue for him.  But these are not people that he sees every day.  These are people that he sees maybe 2 or 3 times a year and cannot remember their names.  He is also had occasions where he cannot remember that he has already met people before.  He returns today for an evaluation.  He continues on Keppra for seizures.  HISTORY BISHOP VANDERWERF is a 58 year old  Caucasian male patient and seen  In a face to face consultation on 09/17/2019 initiated by Butch Penny, NP. He is established as a seizure patient with Dr Anne Hahn, has epilepsy since age 29.    Chief concern according to patient : " I may have apnea. "   I have the pleasure of seeing BREANNA MCDANIEL today, a right-handed Caucasian male with a possible sleep disorder.  he   has a past medical history of HYPERLIPIDEMIA (06/28/2007) and SEIZURE DISORDER (06/28/2007).   Sleep relevant medical history:  Night terrors as a child, Tonsillectomy in childhood, had some concussion. Family medical /sleep history: 2 brothers  on CPAP with OSA, no insomnia, no sleep walkers.  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  The patient currently works in daytime form office. Pets are present- 3 dogs. Tobacco use; 1 year in college. ETOH use: on weekends 2-4 glasses., Caffeine intake in form of Coffee( 3 cups in AM ) Soda( one a day ) Tea ( decaffeinated ) and [employer] or [employer]  energy drinks. Regular exercise: none.   Hobbies : Tennis      Sleep habits are as follows: The patient's dinner  time is between 7.30 PM. The patient goes to bed at 11.30 PM and watches TV until midnight- continues to sleep for several  hours, wakes rarely  for bathroom breaks. The preferred sleep position is laterrally and prone, with the support of 2 pillows.  Dreams are reportedly rare.  6.30 AM is the usual rise time.  The patient wakes up with an alarm or by his dogs.Marland Kitchen  He reports often not feeling refreshed or restored in AM, with a desire to sleep another hour-  symptoms such as dry mouth , throat is sore, morning headaches and residual fatigue.  Naps are taken rarely-   REVIEW OF SYSTEMS: Out of a complete 14 system review of symptoms, the patient complains only of the following symptoms, and all  other reviewed systems are negative.  See HPI  ALLERGIES: Allergies  Allergen Reactions   Penicillins Other (See Comments)    chills    HOME MEDICATIONS: Outpatient Medications Prior to Visit  Medication Sig Dispense Refill   albuterol (VENTOLIN HFA) 108 (90 Base) MCG/ACT inhaler INHALE 2 PUFFS EVERY 4 HOURS AS NEEDED FOR WHEEZE, COUGH OR SHORTNESS OF BREATH     allopurinol (ZYLOPRIM) 100 MG tablet Take 200 mg by mouth daily.     atorvastatin (LIPITOR) 40 MG tablet Take 40 mg by mouth daily.     beclomethasone (QVAR REDIHALER) 80 MCG/ACT inhaler INHALE 1 PUFF TWICE A DAY AND RINSE MOUTH WITH WATER AFTER USE     colchicine 0.6 MG tablet Take 0.6 mg by mouth 2 (two) times daily as needed.     Ibuprofen (ADVIL PO) Take by mouth daily as needed.      Levetiracetam 750 MG TB24 TAKE 2 TABLETS (1,500 MG TOTAL) BY MOUTH EVERY EVENING. 180 tablet 3   propranolol (INDERAL) 20 MG tablet TAKE 1 TABLET (20 MG TOTAL) BY MOUTH AS NEEDED. 30 tablet 5   sertraline (ZOLOFT) 25 MG tablet Take 25 mg by mouth daily.     telmisartan (MICARDIS) 40 MG tablet SMARTSIG:1 Tablet(s) By Mouth Every Evening     Testosterone 20.25 MG/ACT (1.62%) GEL Place onto the skin.     WEGOVY 1 MG/0.5ML SOAJ      No facility-administered medications prior to visit.    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   WISDOM TOOTH EXTRACTION      FAMILY HISTORY: Family History  Problem Relation Age of Onset   Stomach cancer Mother 43       gastric CA   Heart disease Father 81       fatal mi   Heart  attack Father 27   Cancer Sister        breast   Breast cancer Sister 35   Coronary artery disease Paternal Uncle 27   Colon cancer Neg Hx    Colon polyps Neg Hx    Esophageal cancer Neg Hx    Rectal cancer Neg Hx  SOCIAL HISTORY: Social History   Socioeconomic History   Marital status: Married    Spouse name: Not on file   Number of children: 2   Years of education: MA   Highest education level: Not on file  Occupational History   Occupation: Pharmaceuticals  Tobacco Use   Smoking status: Former    Types: Cigars   Smokeless tobacco: Never  Vaping Use   Vaping status: Never Used  Substance and Sexual Activity   Alcohol use: Yes    Alcohol/week: 5.0 standard drinks of alcohol    Types: 2 Glasses of wine, 2 Cans of beer, 1 Shots of liquor per week   Drug use: No   Sexual activity: Not on file  Other Topics Concern   Not on file  Social History Narrative   Lives at home w/ wife and 2 children.   Patient is right handed.   Patient drinks some caffeine daily   Social Drivers of Corporate investment banker Strain: Not on file  Food Insecurity: Not on file  Transportation Needs: Not on file  Physical Activity: Not on file  Stress: Not on file  Social Connections: Unknown (03/13/2022)   Received from [facility]   Social Network    Social Network: Not on file  Intimate Partner Violence: Unknown (02/02/2022)   Received from [facility]   HITS    Physically Hurt: Not on file    Insult or Talk Down To: Not on file    Threaten Physical Harm: Not on file    Scream or Curse: Not on file      PHYSICAL EXAM  There were no vitals filed for this visit.  There is no height or weight on file to calculate BMI.  Generalized: Well developed, in no acute distress  Chest: Lungs clear to auscultation bilaterally  Neurological examination  Mentation: Alert oriented to time, place, history taking. Follows all commands speech and language  fluent Cranial nerve II-XII: Extraocular movements were full, visual field were full on confrontational test Head turning and shoulder shrug  were normal and symmetric. Motor: The motor testing reveals 5 over 5 strength of all 4 extremities. Good symmetric motor tone is noted throughout.  Sensory: Sensory testing is intact to soft touch on all 4 extremities. No evidence of extinction is noted.  Gait and station: Gait is normal.    DIAGNOSTIC DATA (LABS, IMAGING, TESTING) - I reviewed patient records, labs, notes, testing and imaging myself where available.  Lab Results  Component Value Date   WBC 7.5 07/09/2019   HGB 13.4 07/09/2019   HCT 40.4 07/09/2019   MCV 94 07/09/2019   PLT 226 07/09/2019      Component Value Date/Time   NA 137 07/09/2019 0956   K 4.3 07/09/2019 0956   CL 99 07/09/2019 0956   CO2 19 (L) 07/09/2019 0956   GLUCOSE 96 07/09/2019 0956   GLUCOSE 93 08/28/2013 0912   BUN 17 07/09/2019 0956   CREATININE 0.92 07/09/2019 0956   CALCIUM 9.4 07/09/2019 0956   PROT 7.3 07/09/2019 0956   ALBUMIN 4.8 07/09/2019 0956   AST 33 07/09/2019 0956   ALT 40 07/09/2019 0956   ALKPHOS 56 07/09/2019 0956   BILITOT 0.4 07/09/2019 0956   GFRNONAA 94 07/09/2019 0956   GFRAA 109 07/09/2019 0956   Lab Results  Component Value Date   CHOL 178 04/23/2015   HDL 44.80 04/23/2015   LDLCALC 107 (H) 04/23/2015   TRIG  130.0 04/23/2015   CHOLHDL 4 04/23/2015   No results found for: "HGBA1C" Lab Results  Component Value Date   VITAMINB12 707 07/09/2019   Lab Results  Component Value Date   TSH 3.030 07/09/2019      ASSESSMENT AND PLAN 58 y.o. year old male  has a past medical history of Anxiety, Arthritis, HYPERLIPIDEMIA (06/28/2007), SEIZURE DISORDER (06/28/2007), Shoulder pain, and Sleep apnea. here with:  OSA on CPAP  CPAP compliance excellent Good treatment of AHI  Encourage patient to use CPAP nightly and > 4 hours each night  2.  Seizures  Continue Keppra    3.  Memory disturbance  Discussed referral for neuropsychological testing however he deferred for now.  - F/U in 1 year or sooner if needed   I spent 25 minutes of face-to-face and non-face-to-face time with patient.  This included previsit chart review, lab review, study review, order entry, electronic health record documentation, patient education.  Butch Penny, MSN, NP-C 01/11/2024, 10:51 AM [facility] [address], [address] [place], [place] [address] [phone]

## 2024-01-14 ENCOUNTER — Ambulatory Visit: Payer: 59 | Admitting: Adult Health

## 2024-01-14 ENCOUNTER — Encounter: Payer: Self-pay | Admitting: Adult Health

## 2024-01-14 DIAGNOSIS — Z0289 Encounter for other administrative examinations: Secondary | ICD-10-CM

## 2024-03-06 ENCOUNTER — Telehealth: Payer: Self-pay

## 2024-03-06 NOTE — Telephone Encounter (Signed)
 Pt LVM on sleep lab phone requesting to schedule an appt

## 2024-03-11 ENCOUNTER — Telehealth: Payer: Self-pay | Admitting: Neurology

## 2024-03-11 NOTE — Telephone Encounter (Signed)
 Patient was transferred from Sleep Lab. Patient said, he was transferred to here, do not know why, but need to schedule to have a sleep appointment  Reviewed patient's chart could not find where patient had had sleep consult or referral. Informed the patient of no referral in chart; would need to have physician fax a referral for a sleep consult. Patient said, Dr. Genelle Kennedy sent referral a couple of months ago. Patient asked to be transferred back to sleep to ask why no one have been returning his phone calls or responding to voicemails left in the sleep department.  Transferred patient back to sleep department. Notified Sleep through Teams why transferred call back.

## 2024-03-11 NOTE — Telephone Encounter (Signed)
 Spoke with Mariah Shines at [facility] about patient referral and concerns for sleep appointment. There was some confusion on if the patient did need a new referral to be seen since he was last seen by Dr. Albertina Hugger for sleep concerns in 2020 but is still technically an active patient in our office through MM, NP and scheduled to see her in August. I let Mariah Shines know I would see what we could do about getting him scheduled for sleep sooner than August and if we needed a new referral for him I would call her back and let her know. Will discuss this with Dr. Albertina Hugger and schedule the patient accordingly.

## 2024-03-27 NOTE — Progress Notes (Signed)
 Manuel Hartman

## 2024-03-31 ENCOUNTER — Ambulatory Visit (INDEPENDENT_AMBULATORY_CARE_PROVIDER_SITE_OTHER): Admitting: Neurology

## 2024-03-31 ENCOUNTER — Encounter: Payer: Self-pay | Admitting: Neurology

## 2024-03-31 VITALS — BP 118/82 | HR 56 | Ht 72.0 in | Wt 253.8 lb

## 2024-03-31 DIAGNOSIS — G4733 Obstructive sleep apnea (adult) (pediatric): Secondary | ICD-10-CM | POA: Diagnosis not present

## 2024-03-31 DIAGNOSIS — R0683 Snoring: Secondary | ICD-10-CM | POA: Diagnosis not present

## 2024-03-31 NOTE — Progress Notes (Signed)
 SLEEP MEDICINE CLINIC    Provider:  Neomia Banner, MD  Primary Care Physician:  Aldo Hun, MD [address] [place] [address]     Referring Provider: Aldo Hun, Md [address] [place],  [place] [address]          Chief Complaint according to patient   Patient presents with:     New Patient (Initial Visit)           HISTORY OF PRESENT ILLNESS:  Manuel Hartman is a 58 y.o. male patient who is seen upon referral on 03/31/2024 from Dr Genelle Kennedy for a first CPAP follow up.  Chief concern according to patient :  Here for  the first time on CPAP, which I prescribed 2021.  Mr. Cartmell was tested by a home sleep test in February 2021 and he was referred by Dr. Imagene Mam.  [facility] has followed him for epilepsy since age 56.  He was on Adderall and Keppra .  Adderall is used for ADD or ADHD his wife had reported snoring.  At the time of the test the patient endorsed the Epworth at only 1 point his BMI was 33.  And the home sleep test recorded a moderately severe degree of sleep apnea with loud snoring there was more complex sleep apnea he had more apnea and non-REM REM sleep than in rem sleep.  He also had intermittent bradycardia excellent response to apneic events without any prolonged hypoxia being seen.  The order for the auto titration was from 6 to 12 cm water pressure was 2 cm EPR that is the setting he is using now.  His overall AHI or apnea hypopnea index, had been 34.4/h so I would actually call the severe obstructive sleep apnea.  The bradycardia slowed his heart rate down to 44 bpm at night.  And there was only 2.4 minutes of hypoxia in total.  Now 4 years later Mr. Dorner is still using his CPAP with high compliance.  100% 8 hours on average each night setting between 6 and 12 cm water with 3 cm EPR and his residual AHI is 0.7/h 2 this is a reduction by 95%.  His BMI has reached 34.4 there has been some weight gain since he initially was tested.  The 95th percentile pressure  is 9.5 cm water and I suggested today to bump up his maximum pressure to 14.  I reviewed his current medication he is on colchicine for gout he takes Keppra  for seizure prevention, he has started on Wegovy for weight loss he is also on testosterone, Lipitor, Zyloprim, Ventolin inhaler and Inderal .    Today's Epworth sleepiness score is and again endorsed at 1 point and he does not endorse a high level of fatigue.    Overall he states that he is feeling very well and he is sleeping restorative sleep and refreshed in the morning   I have the pleasure of seeing Manuel Hartman 03/31/24 a right-handed male with a known OSA  sleep disorder.  His machine will be replaced in 2026. has a past medical history of HYPERLIPIDEMIA (06/28/2007) and SEIZURE DISORDER (06/28/2007).        Sleep relevant medical history:  he has 4 brothers and one sister , has one brother with apnea, father died at age 47 of a heart attack.   Family medical /sleep history: No other family member on CPAP with OSA, insomnia, sleep walkers.    Social history:  Patient is working for [employer] and lives in a household with spouse,  one adult child still at home. One son is  grown.  The patient currently works. Travel a lot, may need a travel CPAP . Pets ;  2 dogs,  they sleep in the bedroom. . Tobacco use; none .   ETOH use  on :weekends  2-3 drinks at night ,  Caffeine intake in form of Coffee( 2 a t day) Soda( /) Tea ( /) no energy drinks Exercise - none .   Hobbies :none       Sleep habits are as follows: The patient's dinner time is between 6-7 PM. The patient goes to bed at 10-11 PM and continues to sleep for 8 hours, wakes for none or one bathroom breaks, dreaming  infrequently.   The patient wakes up spontaneously or by the dogs- 7 AM w, 7.15  AM is the usual rise time. He/ reports  feeling refreshed or restored in AM. Once a week headaches, not sleep related.     Review of Systems: Out of a complete 14 system  review, the patient complains of only the following symptoms, and all other reviewed systems are negative.:     How likely are you to doze in the following situations: 0 = not likely, 1 = slight chance, 2 = moderate chance, 3 = high chance   Sitting and Reading? Watching Television? Sitting inactive in a public place (theater or meeting)? As a passenger in a car for an hour without a break? Lying down in the afternoon when circumstances permit? Sitting and talking to someone? Sitting quietly after lunch without alcohol ? In a car, while stopped for a few minutes in traffic?   Total = 1/ 24 points   FSS endorsed at 9/ 63 points.   Social History   Socioeconomic History   Marital status: Married    Spouse name: Not on file   Number of children: 2   Years of education: MA   Highest education level: Not on file  Occupational History   Occupation: [employer]  Tobacco Use   Smoking status: Former    Types: Cigars   Smokeless tobacco: Never  Vaping Use   Vaping status: Never Used  Substance and Sexual Activity   Alcohol  use: Yes    Alcohol /week: 5.0 standard drinks of alcohol     Types: 2 Glasses of wine, 2 Cans of beer, 1 Shots of liquor per week   Drug use: No   Sexual activity: Not on file  Other Topics Concern   Not on file  Social History Narrative   Lives at home w/ wife and 2 children.   Patient is right handed.   Patient drinks some caffeine daily   Social Drivers of Corporate investment banker Strain: Not on file  Food Insecurity: Not on file  Transportation Needs: Not on file  Physical Activity: Not on file  Stress: Not on file  Social Connections: Unknown (03/13/2022)   Received from [facility], [facility]   Social Network    Social Network: Not on file    Family History  Problem Relation Age of Onset   Stomach cancer Mother 18       gastric CA   Heart disease Father 37       fatal mi   Heart attack Father 66   Cancer Sister         breast   Breast cancer Sister 57   Coronary artery disease Paternal Uncle 72  Colon cancer Neg Hx    Colon polyps Neg Hx    Esophageal cancer Neg Hx    Rectal cancer Neg Hx     Past Medical History:  Diagnosis Date   Anxiety    on meds   Arthritis    gout associated   HYPERLIPIDEMIA 06/28/2007   on meds   SEIZURE DISORDER 06/28/2007   on meds-last seziure in 1998 per pt report   Shoulder pain    left   Sleep apnea    uses CPAP    Past Surgical History:  Procedure Laterality Date   APPENDECTOMY  1994   SURGERY SCROTAL / TESTICULAR     TESTICLE SURGERY     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     atorvastatin  (LIPITOR) 40 MG tablet Take 40 mg by mouth daily.     beclomethasone (QVAR REDIHALER) 80 MCG/ACT inhaler Inhale 1 puff into the lungs daily as needed (Once daily as needed shortness of breath or wheezing).     Ibuprofen (ADVIL PO) Take by mouth daily as needed.      Levetiracetam  750 MG TB24 TAKE 2 TABLETS (1,500 MG TOTAL) BY MOUTH EVERY EVENING. 180 tablet 3   Multiple Vitamin (MULTI VITAMIN DAILY PO) Take 1 tablet by mouth daily.     sertraline (ZOLOFT) 25 MG tablet Take 25 mg by mouth daily.     telmisartan (MICARDIS) 40 MG tablet SMARTSIG:1 Tablet(s) By Mouth Every Evening     Testosterone 20.25 MG/ACT (1.62%) GEL Place onto the skin.     WEGOVY 1 MG/0.5ML SOAJ      propranolol  (INDERAL ) 20 MG tablet TAKE 1 TABLET (20 MG TOTAL) BY MOUTH AS NEEDED. (Patient taking differently: Take 20 mg by mouth daily as needed.) 30 tablet 5   No current facility-administered medications on file prior to visit.    Allergies  Allergen Reactions   Penicillins Other (See  Comments)    chills     DIAGNOSTIC DATA (LABS, IMAGING, TESTING) - I reviewed patient records, labs, notes, testing and imaging myself where available.  Lab Results  Component Value Date   WBC 7.5 07/09/2019   HGB 13.4 07/09/2019   HCT 40.4 07/09/2019   MCV 94 07/09/2019   PLT 226 07/09/2019      Component Value Date/Time   NA 137 07/09/2019 0956   K 4.3 07/09/2019 0956   CL 99 07/09/2019 0956   CO2 19 (L) 07/09/2019 0956   GLUCOSE 96 07/09/2019 0956   GLUCOSE 93 08/28/2013 0912   BUN 17 07/09/2019 0956   CREATININE 0.92 07/09/2019 0956   CALCIUM  9.4 07/09/2019 0956   PROT 7.3 07/09/2019 0956   ALBUMIN 4.8 07/09/2019 0956   AST 33 07/09/2019 0956   ALT 40 07/09/2019 0956   ALKPHOS 56 07/09/2019 0956   BILITOT 0.4 07/09/2019 0956   GFRNONAA 94 07/09/2019 0956   GFRAA 109 07/09/2019 0956   Lab Results  Component Value Date   CHOL 178 04/23/2015   HDL 44.80 04/23/2015   LDLCALC 107 (H) 04/23/2015   TRIG 130.0 04/23/2015   CHOLHDL 4 04/23/2015   No results found  for: "HGBA1C" Lab Results  Component Value Date   VITAMINB12 707 07/09/2019   Lab Results  Component Value Date   TSH 3.030 07/09/2019    PHYSICAL EXAM:  Today's Vitals   03/31/24 0850  BP: 118/82  Pulse: (!) 56  Weight: 253 lb 12.8 oz (115.1 kg)  Height: 6' (1.829 m)   Body mass index is 34.42 kg/m.   Wt Readings from Last 3 Encounters:  03/31/24 253 lb 12.8 oz (115.1 kg)  04/24/23 250 lb (113.4 kg)  04/03/23 250 lb (113.4 kg)     Ht Readings from Last 3 Encounters:  03/31/24 6' (1.829 m)  04/24/23 6' (1.829 m)  04/03/23 6' (1.829 m)      General: The patient is awake, alert and appears not in acute distress. The patient is well groomed. Head: Normocephalic, atraumatic.  Neck is supple.  Mallampati 3,  neck circumference:18.25  inches . Nasal airflow not fully  patent.   seasonal component Retrognathia is not seen.  Dental status: biological  Cardiovascular:  Regular rate and  cardiac rhythm by pulse,  without distended neck veins. Respiratory: Lungs are clear to auscultation.  Skin:  Without evidence of ankle edema, or rash. Trunk: The patient's posture is erect.   NEUROLOGIC EXAM: The patient is awake and alert, oriented to place and time.   Memory subjective described as intact.  Attention span & concentration ability appears normal.  Speech is fluent,  without  dysarthria, dysphonia or aphasia.  Mood and affect are appropriate.   Cranial nerves: no loss of smell or taste reported  Pupils are equal and briskly reactive to light. Funduscopic exam deferred.  Extraocular movements in vertical and horizontal planes were intact and without nystagmus. No Diplopia. Visual fields by finger perimetry are intact. Hearing was intact to soft voice and finger rubbing.    Facial sensation intact to fine touch.  Facial motor strength is symmetric and tongue and uvula move midline.  Neck ROM : rotation, tilt and flexion extension were normal for age . Right shoulder has ROM problems and sits lower shrug was symmetrical.    Motor exam:  Symmetric bulk, tone and ROM.   Normal tone without cog wheeling, symmetric grip strength .   Sensory:    normal.  Proprioception tested in the upper extremities was normal.   Coordination: Rapid alternating movements in the fingers/hands were of normal speed.  The Finger-to-nose maneuver was intact without evidence of ataxia, dysmetria or tremor.   Gait and station: Patient could rise unassisted from a seated position, walked without assistive device.  Deep tendon reflexes: in the  upper and lower extremities are symmetric and intact.  Babinski response was deferred   ASSESSMENT AND PLAN 58 y.o. year old male  here with:    1) OSA on CPAP,  EXCELLENT COMPLIANCE_ severe OSA- will adjust his pressure settings by 2 cm upward. And he is due for new machine and baseline by HST next year. He prefers nasal pillows.  I will write for a  travel CPAP, ResMED , AIR MINI, set at 9 cm water.   He has had severe apnea in 2021, and could be qualified for weight loss medications.   I plan to follow up either personally or through our NP within 6- 8 months.   I would like to thank  Aldo Hun, Md [address] [place],  [place] [address] for allowing me to meet with and to take care of this pleasant patient.   After spending  a total time of  35  minutes face to face and additional time for physical and neurologic examination, review of laboratory studies,  personal review of imaging studies, reports and results of other testing and review of referral information / records as far as provided in visit,   Electronically signed by: Neomia Banner, MD 03/31/2024 9:23 AM  [facility] Board certified by The [employer] of Sleep Medicine and Diplomate of the [employer] of Sleep Medicine. Board certified In Neurology through the ABPN, Fellow of the [employer] of Neurology.

## 2024-03-31 NOTE — Patient Instructions (Signed)
 ASSESSMENT AND PLAN 58 y.o. year old male  here with:     1) OSA on CPAP,  EXCELLENT COMPLIANCE_ severe OSA- will adjust his pressure settings by 2 cm upward. And he is due for new machine and baseline by HST next year. He prefers nasal pillows.  I will write for a travel CPAP, ResMED , AIR MINI, set at 9 cm water.    I plan to follow up either personally or through our NP within 6- 8 months.    I would like to thank  Aldo Hun, Md [address] [place],  [place] [address] for allowing me to meet with and to take care of this pleasant patient.

## 2024-06-23 ENCOUNTER — Ambulatory Visit (INDEPENDENT_AMBULATORY_CARE_PROVIDER_SITE_OTHER): Admitting: Orthopedic Surgery

## 2024-06-23 ENCOUNTER — Other Ambulatory Visit: Payer: Self-pay

## 2024-06-23 DIAGNOSIS — M7551 Bursitis of right shoulder: Secondary | ICD-10-CM | POA: Diagnosis not present

## 2024-06-23 DIAGNOSIS — M25511 Pain in right shoulder: Secondary | ICD-10-CM | POA: Diagnosis not present

## 2024-06-23 DIAGNOSIS — M25562 Pain in left knee: Secondary | ICD-10-CM | POA: Diagnosis not present

## 2024-06-24 ENCOUNTER — Encounter: Payer: Self-pay | Admitting: Orthopedic Surgery

## 2024-06-24 MED ORDER — LIDOCAINE HCL 1 % IJ SOLN
5.0000 mL | INTRAMUSCULAR | Status: AC | PRN
Start: 2024-06-23 — End: 2024-06-23
  Administered 2024-06-23: 5 mL

## 2024-06-24 MED ORDER — BUPIVACAINE HCL 0.5 % IJ SOLN
9.0000 mL | INTRAMUSCULAR | Status: AC | PRN
Start: 2024-06-23 — End: 2024-06-23
  Administered 2024-06-23: 9 mL via INTRA_ARTICULAR

## 2024-06-24 MED ORDER — TRIAMCINOLONE ACETONIDE 40 MG/ML IJ SUSP
40.0000 mg | INTRAMUSCULAR | Status: AC | PRN
  Administered 2024-06-23: 40 mg via INTRA_ARTICULAR

## 2024-06-24 NOTE — Progress Notes (Signed)
 Office Visit Note   Patient: Manuel Hartman           Date of Birth: [birth year]           MRN: [id number] Visit Date: 06/23/2024 Requested by: Shayne Anes, MD [address] [place],  [place] [address] PCP: Shayne Anes, MD  Subjective: Chief Complaint  Patient presents with   Right Shoulder - Pain    HPI: Manuel Hartman is a 58 y.o. male who presents to the office reporting right shoulder pain since 2021.  Patient is right-hand dominant.  Pain does not wake him from sleep at night.  No numbness and tingling with no radicular symptoms.  He also describes left knee pain.  This popped and e-bike trying to get him straight.  Has borrowed 2 days and is having some daily pain at this time.  Patient does have to lift luggage a lot.  Ibuprofen helps the knee.  He did take a steroid Dosepak for upper respiratory infection which helped his right shoulder.  Going to [place] for 2 weeks September 10.              ROS: All systems reviewed are negative as they relate to the chief complaint within the history of present illness.  Patient denies fevers or chills.  Assessment & Plan: Visit Diagnoses:  1. Right shoulder pain, unspecified chronicity   2. Left knee pain, unspecified chronicity     Plan: Impression is right shoulder bursitis with no evidence of frozen shoulder or rotator cuff pathology.  Did have MRI scanning back then which showed tendinosis but no discrete full-thickness rotator cuff tear.  Plan is to repeat subacromial injection today.  Left knee exam and radiographs are normal that something we can follow.  Could consider injection into that left knee if symptoms do not improve  Follow-Up Instructions: No follow-ups on file.   Orders:  Orders Placed This Encounter  Procedures   XR Shoulder Right   XR KNEE 3 VIEW LEFT   No orders of the defined types were placed in this encounter.     Procedures: Large Joint Inj: R subacromial bursa on 06/23/2024 3:32 PM Indications:  diagnostic evaluation and pain Details: 18 G 1.5 in needle, posterior approach  Arthrogram: No  Medications: 9 mL bupivacaine  0.5 %; 5 mL lidocaine  1 %; 40 mg triamcinolone  acetonide 40 MG/ML Outcome: tolerated well, no immediate complications Procedure, treatment alternatives, risks and benefits explained, specific risks discussed. Consent was given by the patient. Immediately prior to procedure a time out was called to verify the correct patient, procedure, equipment, support staff and site/side marked as required. Patient was prepped and draped in the usual sterile fashion.       Clinical Data: No additional findings.  Objective: Vital Signs: There were no vitals taken for this visit.  Physical Exam:  Constitutional: Patient appears well-developed HEENT:  Head: Normocephalic Eyes:EOM are normal Neck: Normal range of motion Cardiovascular: Normal rate Pulmonary/chest: Effort normal Neurologic: Patient is alert Skin: Skin is warm Psychiatric: Patient has normal mood and affect  Ortho Exam: Ortho exam demonstrates normal gait and alignment.  Left knee has no focal joint line tenderness stable collateral cruciate ligaments mild anterior retinacular knee pain but no discrete patellar tendon tenderness, no effusion.  Right shoulder demonstrates full active and passive range of motion.  No discrete AC joint tenderness rotator cuff strength intact infraspinatus supraspinatus and subscap muscle testing no masses lymphadenopathy or skin changes noted in that shoulder  girdle region.  Negative O'Brien's testing.  Positive impingement signs.  Shoulder range of motion is 60/100/165.  Specialty Comments:  No specialty comments available.  Imaging: No results found.   PMFS History: Patient Active Problem List   Diagnosis Date Noted   Family history of sleep apnea 12/17/2019   Snoring 12/17/2019   Seizures (HCC) 12/17/2019   Complaints of memory disturbance 12/17/2019   Chest pain  02/04/2016   ADHD (attention deficit hyperactivity disorder) 12/01/2011   Hyperlipidemia 06/28/2007   Convulsions (HCC) 06/28/2007   Past Medical History:  Diagnosis Date   Anxiety    on meds   Arthritis    gout associated   HYPERLIPIDEMIA 06/28/2007   on meds   SEIZURE DISORDER 06/28/2007   on meds-last seziure in 1998 per pt report   Shoulder pain    left   Sleep apnea    uses CPAP    Family History  Problem Relation Age of Onset   Stomach cancer Mother 68       gastric CA   Heart disease Father 47       fatal mi   Heart attack Father 6   Cancer Sister        breast   Breast cancer Sister 73   Coronary artery disease Paternal Uncle 109   Colon cancer Neg Hx    Colon polyps Neg Hx    Esophageal cancer Neg Hx    Rectal cancer Neg Hx     Past Surgical History:  Procedure Laterality Date   APPENDECTOMY  1994   SURGERY SCROTAL / TESTICULAR     TESTICLE SURGERY     undecended as a child   WISDOM TOOTH EXTRACTION     Social History   Occupational History   Occupation: Doctor, general practice  Tobacco Use   Smoking status: Former    Types: Cigars   Smokeless tobacco: Never  Vaping Use   Vaping status: Never Used  Substance and Sexual Activity   Alcohol  use: Yes    Alcohol /week: 5.0 standard drinks of alcohol     Types: 2 Glasses of wine, 2 Cans of beer, 1 Shots of liquor per week   Drug use: No   Sexual activity: Not on file

## 2024-06-25 ENCOUNTER — Ambulatory Visit (INDEPENDENT_AMBULATORY_CARE_PROVIDER_SITE_OTHER): Admitting: Adult Health

## 2024-06-25 ENCOUNTER — Encounter: Payer: Self-pay | Admitting: Adult Health

## 2024-06-25 VITALS — BP 120/80 | HR 56 | Ht 72.0 in | Wt 248.0 lb

## 2024-06-25 DIAGNOSIS — G40001 Localization-related (focal) (partial) idiopathic epilepsy and epileptic syndromes with seizures of localized onset, not intractable, with status epilepticus: Secondary | ICD-10-CM

## 2024-06-25 MED ORDER — LEVETIRACETAM ER 750 MG PO TB24: 1500.0000 mg | ORAL_TABLET | Freq: Every evening | ORAL | 3 refills | Status: AC

## 2024-06-25 NOTE — Patient Instructions (Addendum)
 Your Plan:  Continue Keppra  extended release 1500 mg daily Avoid alcohol   If you have any seizure-like events please let us  know  SEIZURE PRECAUTIONS Per [place]  DMV statutes, patients with seizures are not allowed to drive until they have been seizure-free for six months.    Use caution when using heavy equipment or power tools. Avoid working on ladders or at heights. Take showers instead of baths. Ensure the water temperature is not too high on the home water heater. Do not go swimming alone. Do not lock yourself in a room alone (i.e. bathroom). When caring for infants or small children, sit down when holding, feeding, or changing them to minimize risk of injury to the child in the event you have a seizure. Maintain good sleep hygiene. Avoid alcohol .    If patient has another seizure, call 911 and bring them back to the ED if: A.  The seizure lasts longer than 5 minutes.      B.  The patient doesn't wake shortly after the seizure or has new problems such as difficulty seeing, speaking or moving following the seizure C.  The patient was injured during the seizure D.  The patient has a temperature over 102 F (39C) E.  The patient vomited during the seizure and now is having trouble breathing    Thank you for coming to see us  at [facility]. I hope we have been able to provide you high quality care today.  You may receive a patient satisfaction survey over the next few weeks. We would appreciate your feedback and comments so that we may continue to improve ourselves and the health of our patients.

## 2024-06-25 NOTE — Progress Notes (Signed)
 PATIENT: Manuel Hartman DOB: [birth year]  REASON FOR VISIT: follow up HISTORY FROM: patient PRIMARY NEUROLOGIST: Dr. Chalice   Chief Complaint  Patient presents with   Follow-up    Pt in 5 alone Pt here for seizure Pt states no questions or concerns for today's visit      HISTORY OF PRESENT ILLNESS: Today 06/25/24   Manuel Hartman is a 58 y.o. male who has been followed in this office for Seizures. Returns today for follow-up. Reports that he is doing well. Denies any seizure events. Reports that he did have an episode about 1 month ago when he was in [place].  He states that they had been out on a boat all day, drinking alcohol .  He states that he started having a weird sensation like he has in the past when he had seizures But it passed.  He states that he rested the rest of the day and the next day he felt fine.  His last true seizure was 15 years ago.  He does state that possibly that day he was dehydrated and had drank too much alcohol .  He remains on Keppra  extended release 1500 mg daily   HISTORY Mr. Chacko is a 58 year old male with a history of obstructive sleep apnea on CPAP.  His download indicates that he uses machine 24 out of 30 days for compliance of 80%.  He uses machine greater than 4 hours 21 days for compliance of 70%.  On average he uses his machine 6 hours and 19 minutes.  His residual AHI is 2.3 on 6 to 16 cm of water with EPR 3.  Leak in the 95th percentile is 11.6 L/min. He reports that his wife has enjoyed him using the CPAP as he no longer snores.  He is not sure that he can tell a difference at this time.  Patient feels that his memory has remained the same.  He does not have issues with short-term memory.  But rather long-term.  He states remembering names is an issue for him.  But these are not people that he sees every day.  These are people that he sees maybe 2 or 3 times a year and cannot remember their names.  He is also had occasions where he cannot  remember that he has already met people before.  He returns today for an evaluation.   He continues on Keppra  for seizures.    REVIEW OF SYSTEMS: Out of a complete 14 system review of symptoms, the patient complains only of the following symptoms, and all other reviewed systems are negative.   Listed in HPI  ALLERGIES: Allergies  Allergen Reactions   Penicillins Other (See Comments)    chills    HOME MEDICATIONS: Outpatient Medications Prior to Visit  Medication Sig Dispense Refill   albuterol (VENTOLIN HFA) 108 (90 Base) MCG/ACT inhaler INHALE 2 PUFFS EVERY 4 HOURS AS NEEDED FOR WHEEZE, COUGH OR SHORTNESS OF BREATH     allopurinol (ZYLOPRIM) 100 MG tablet Take 200 mg by mouth daily.     atorvastatin  (LIPITOR) 40 MG tablet Take 40 mg by mouth daily.     beclomethasone (QVAR REDIHALER) 80 MCG/ACT inhaler Inhale 1 puff into the lungs daily as needed (Once daily as needed shortness of breath or wheezing).     Ibuprofen (ADVIL PO) Take by mouth daily as needed.      Multiple Vitamin (MULTI VITAMIN DAILY PO) Take 1 tablet by mouth daily.     sertraline (  ZOLOFT) 25 MG tablet Take 25 mg by mouth daily.     telmisartan (MICARDIS) 40 MG tablet SMARTSIG:1 Tablet(s) By Mouth Every Evening     Testosterone 20.25 MG/ACT (1.62%) GEL Place onto the skin.     WEGOVY 1 MG/0.5ML SOAJ      Levetiracetam  750 MG TB24 TAKE 2 TABLETS (1,500 MG TOTAL) BY MOUTH EVERY EVENING. 180 tablet 3   propranolol  (INDERAL ) 20 MG tablet TAKE 1 TABLET (20 MG TOTAL) BY MOUTH AS NEEDED. (Patient taking differently: Take 20 mg by mouth daily as needed.) 30 tablet 5   No facility-administered medications prior to visit.    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   WISDOM TOOTH EXTRACTION      FAMILY HISTORY: Family History  Problem Relation Age of Onset   Stomach cancer Mother 85       gastric CA   Heart disease Father 4       fatal mi   Heart attack Father 40   Cancer Sister        breast   Breast cancer Sister 48   Coronary artery disease Paternal Uncle 56   Colon cancer Neg Hx    Colon polyps Neg Hx    Esophageal cancer Neg Hx    Rectal cancer Neg Hx     SOCIAL HISTORY: Social History   Socioeconomic History   Marital status: Married    Spouse name: Not on file   Number of children: 2   Years of education: MA   Highest education level: Not on file  Occupational History   Occupation: [employer]  Tobacco Use   Smoking status: Former    Types: Cigars   Smokeless tobacco: Never  Vaping Use   Vaping status: Never Used  Substance and Sexual Activity   Alcohol  use: Yes    Alcohol /week: 5.0 standard drinks of alcohol     Types: 2 Glasses of wine, 3 Cans of beer per week   Drug use: No   Sexual activity: Not on file  Other Topics Concern   Not on file  Social History Narrative   Lives at home w/ wife and 2 children.   Patient is right handed.   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 [employer] Insecurity: Not on file  Transportation Needs: Not on file  Physical Activity: Not on file  Stress: Not on file  Social Connections: Unknown (03/13/2022)   Received from [facility]   Social Network    Social Network: Not on file  Intimate Partner Violence: Unknown (02/02/2022)   Received from [facility]   HITS    Physically Hurt: Not on file    Insult or Talk Down To: Not on file    Threaten Physical Harm: Not on file    Scream or Curse: Not on file  PHYSICAL  EXAM  Vitals:   06/25/24 1104  BP: 120/80  Pulse: (!) 56  Weight: 248 lb (112.5 kg)  Height: 6' (1.829 m)   Body mass index is 33.63 kg/m.  Generalized: Well developed, in no acute distress   Neurological examination  Mentation: Alert oriented to time, place, history taking. Follows all commands speech and language fluent Cranial nerve II-XII: Pupils were equal round reactive to light. Extraocular movements were full, visual field were full on confrontational test. Facial sensation and strength were normal.  Motor: The motor testing reveals 5 over 5 strength of all 4 extremities. Good symmetric motor tone is noted throughout.  Sensory: Sensory testing is intact to soft touch on all 4 extremities. No evidence of extinction is noted.  Coordination: Cerebellar testing reveals good finger-nose-finger and heel-to-shin bilaterally.  Gait and station: Gait is normal.    DIAGNOSTIC DATA (LABS, IMAGING, TESTING) - I reviewed patient records, labs, notes, testing and imaging myself where available.  Lab Results  Component Value Date   WBC 7.5 07/09/2019   HGB 13.4 07/09/2019   HCT 40.4 07/09/2019   MCV 94 07/09/2019   PLT 226 07/09/2019      Component Value Date/Time   NA 137 07/09/2019 0956   K 4.3 07/09/2019 0956   CL 99 07/09/2019 0956   CO2 19 (L) 07/09/2019 0956   GLUCOSE 96 07/09/2019 0956   GLUCOSE 93 08/28/2013 0912   BUN 17 07/09/2019 0956   CREATININE 0.92 07/09/2019 0956   CALCIUM  9.4 07/09/2019 0956   PROT 7.3 07/09/2019 0956   ALBUMIN 4.8 07/09/2019 0956   AST 33 07/09/2019 0956   ALT 40 07/09/2019 0956   ALKPHOS 56 07/09/2019 0956   BILITOT 0.4 07/09/2019 0956   GFRNONAA 94 07/09/2019 0956   GFRAA 109 07/09/2019 0956   Lab Results  Component Value Date   CHOL 178 04/23/2015   HDL 44.80 04/23/2015   LDLCALC 107 (H) 04/23/2015   TRIG 130.0 04/23/2015   CHOLHDL 4 04/23/2015   No results found for: HGBA1C Lab Results  Component Value Date    VITAMINB12 707 07/09/2019   Lab Results  Component Value Date   TSH 3.030 07/09/2019      ASSESSMENT AND PLAN 58 y.o. year old male  has a past medical history of Anxiety, Arthritis, HYPERLIPIDEMIA (06/28/2007), SEIZURE DISORDER (06/28/2007), Shoulder pain, and Sleep apnea. here with:  Seizures  Continue Keppra  extended release 1500 mg daily Avoid Alcohol   Advised that if he has any seizure-like events he should let us  know Follow-up in 1 year or sooner if needed   Meds ordered this encounter  Medications   levETIRAcetam  (KEPPRA  XR) 750 MG 24 hr tablet    Sig: Take 2 tablets (1,500 mg total) by mouth every evening.    Dispense:  180 tablet    Refill:  3    Supervising Provider:   INES ONETHA NOVAK [[id number]]    Duwaine Russell, MSN, NP-C 06/25/2024, 3:36 PM [facility] [address] [place], [place] [address] [phone]

## 2024-08-28 ENCOUNTER — Telehealth: Payer: Self-pay | Admitting: Neurology

## 2024-08-28 NOTE — Telephone Encounter (Signed)
 Pt called wanting to know when he is eligible for his new Cpap machine, Pt would like a call back.

## 2024-09-01 ENCOUNTER — Encounter: Payer: Self-pay | Admitting: Radiology

## 2025-06-25 ENCOUNTER — Telehealth: Admitting: Adult Health
